# Patient Record
Sex: Male | Born: 1995 | Race: Black or African American | Hispanic: No | Marital: Single | Smoking: Current every day smoker
Health system: Southern US, Community
[De-identification: ages and names within clinical notes are randomized; demographics above are authoritative.]

## PROBLEM LIST (undated history)

## (undated) ENCOUNTER — Emergency Department (HOSPITAL_COMMUNITY): Discharge status: Absent without leave | Payer: Medicaid Other

## (undated) DIAGNOSIS — I609 Nontraumatic subarachnoid hemorrhage, unspecified: Secondary | ICD-10-CM

## (undated) DIAGNOSIS — J45909 Unspecified asthma, uncomplicated: Secondary | ICD-10-CM

## (undated) HISTORY — PX: STOMACH SURGERY: SHX791

## (undated) HISTORY — PX: KNEE SURGERY: SHX244

## (undated) HISTORY — PX: PENIS REVASCULARIZATION SURGERY: SHX740

---
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
## 2015-02-18 ENCOUNTER — Emergency Department (HOSPITAL_COMMUNITY): Payer: Medicaid Other

## 2015-02-18 ENCOUNTER — Emergency Department (HOSPITAL_COMMUNITY)
Admission: EM | Admit: 2015-02-18 | Discharge: 2015-02-18 | Disposition: A | Discharge status: Home / Self Care | Payer: Medicaid Other | Attending: Emergency Medicine | Admitting: Emergency Medicine

## 2015-02-18 ENCOUNTER — Encounter (HOSPITAL_COMMUNITY): Payer: Self-pay | Admitting: Emergency Medicine

## 2015-02-18 DIAGNOSIS — S80812A Abrasion, left lower leg, initial encounter: Secondary | ICD-10-CM | POA: Diagnosis not present

## 2015-02-18 DIAGNOSIS — Y9289 Other specified places as the place of occurrence of the external cause: Secondary | ICD-10-CM | POA: Insufficient documentation

## 2015-02-18 DIAGNOSIS — S61411A Laceration without foreign body of right hand, initial encounter: Secondary | ICD-10-CM | POA: Diagnosis not present

## 2015-02-18 DIAGNOSIS — Y998 Other external cause status: Secondary | ICD-10-CM | POA: Insufficient documentation

## 2015-02-18 DIAGNOSIS — G44319 Acute post-traumatic headache, not intractable: Secondary | ICD-10-CM

## 2015-02-18 DIAGNOSIS — M79641 Pain in right hand: Secondary | ICD-10-CM

## 2015-02-18 DIAGNOSIS — S59902A Unspecified injury of left elbow, initial encounter: Secondary | ICD-10-CM | POA: Insufficient documentation

## 2015-02-18 DIAGNOSIS — Z8679 Personal history of other diseases of the circulatory system: Secondary | ICD-10-CM | POA: Insufficient documentation

## 2015-02-18 DIAGNOSIS — J45909 Unspecified asthma, uncomplicated: Secondary | ICD-10-CM | POA: Diagnosis not present

## 2015-02-18 DIAGNOSIS — S0990XA Unspecified injury of head, initial encounter: Secondary | ICD-10-CM | POA: Insufficient documentation

## 2015-02-18 DIAGNOSIS — Y9389 Activity, other specified: Secondary | ICD-10-CM | POA: Insufficient documentation

## 2015-02-18 DIAGNOSIS — S0003XA Contusion of scalp, initial encounter: Secondary | ICD-10-CM | POA: Diagnosis not present

## 2015-02-18 DIAGNOSIS — Z79899 Other long term (current) drug therapy: Secondary | ICD-10-CM | POA: Insufficient documentation

## 2015-02-18 DIAGNOSIS — S4991XA Unspecified injury of right shoulder and upper arm, initial encounter: Secondary | ICD-10-CM | POA: Insufficient documentation

## 2015-02-18 DIAGNOSIS — S6991XA Unspecified injury of right wrist, hand and finger(s), initial encounter: Secondary | ICD-10-CM | POA: Diagnosis present

## 2015-02-18 DIAGNOSIS — Z880 Allergy status to penicillin: Secondary | ICD-10-CM | POA: Diagnosis not present

## 2015-02-18 DIAGNOSIS — S62309B Unspecified fracture of unspecified metacarpal bone, initial encounter for open fracture: Secondary | ICD-10-CM

## 2015-02-18 DIAGNOSIS — S299XXA Unspecified injury of thorax, initial encounter: Secondary | ICD-10-CM | POA: Insufficient documentation

## 2015-02-18 DIAGNOSIS — S62398B Other fracture of other metacarpal bone, initial encounter for open fracture: Secondary | ICD-10-CM | POA: Diagnosis not present

## 2015-02-18 MED ORDER — OXYCODONE HCL 5 MG PO TABS
5.0000 mg | ORAL_TABLET | Freq: Once | ORAL | Status: AC
Start: 2015-02-18 — End: 2015-02-18
  Administered 2015-02-18: 5 mg via ORAL
  Filled 2015-02-18: qty 1

## 2015-02-18 MED ORDER — CLINDAMYCIN HCL 300 MG PO CAPS
300.0000 mg | ORAL_CAPSULE | Freq: Once | ORAL | Status: AC
  Administered 2015-02-18: 300 mg via ORAL
  Filled 2015-02-18: qty 1

## 2015-02-18 MED ORDER — CLINDAMYCIN HCL 300 MG PO CAPS: 300.0000 mg | ORAL_CAPSULE | Freq: Three times a day (TID) | ORAL | Status: DC

## 2015-02-18 MED ORDER — OXYCODONE HCL 5 MG PO TABS
5.0000 mg | ORAL_TABLET | Freq: Four times a day (QID) | ORAL | Status: DC | PRN
Start: 2015-02-18 — End: 2017-05-16
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
## 2015-02-18 NOTE — ED Notes (Signed)
Ortho aware of need for splint 
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
## 2015-02-18 NOTE — ED Notes (Signed)
Ice applied to right hand.

## 2015-02-18 NOTE — Progress Notes (Signed)
Orthopedic Tech Progress Note Patient Details:  Flavia ShipperDenarius J [facility] 05/30/1996 [id number]  Ortho Devices Type of Ortho Device: Ace wrap, Ulna gutter splint Ortho Device/Splint Location: RUE Ortho Device/Splint Interventions: Ordered, Application   Jennye MoccasinHughes, Glynis Hunsucker Craig 02/18/2015, 7:41 PM

## 2015-02-18 NOTE — ED Notes (Signed)
Sling applied to right arm.  Arm elevated above heart, fingers warm to touch and moves well.  Discharge instructions and prescriptions reviewed, voiced understanding

## 2015-02-26 DIAGNOSIS — Z4801 Encounter for change or removal of surgical wound dressing: Secondary | ICD-10-CM | POA: Insufficient documentation

## 2015-02-26 DIAGNOSIS — Z880 Allergy status to penicillin: Secondary | ICD-10-CM | POA: Insufficient documentation

## 2015-02-26 DIAGNOSIS — Z720 Tobacco use: Secondary | ICD-10-CM | POA: Diagnosis not present

## 2015-02-26 DIAGNOSIS — J45909 Unspecified asthma, uncomplicated: Secondary | ICD-10-CM | POA: Insufficient documentation

## 2015-02-26 DIAGNOSIS — Z79899 Other long term (current) drug therapy: Secondary | ICD-10-CM | POA: Insufficient documentation

## 2015-02-27 ENCOUNTER — Emergency Department (HOSPITAL_COMMUNITY)
Admission: EM | Admit: 2015-02-27 | Discharge: 2015-02-27 | Disposition: A | Discharge status: Home / Self Care | Payer: Medicaid Other | Attending: Emergency Medicine | Admitting: Emergency Medicine

## 2015-02-27 ENCOUNTER — Encounter (HOSPITAL_COMMUNITY): Payer: Self-pay | Admitting: Emergency Medicine

## 2015-02-27 DIAGNOSIS — Z5189 Encounter for other specified aftercare: Secondary | ICD-10-CM

## 2015-02-27 MED ORDER — CLINDAMYCIN HCL 150 MG PO CAPS: 300.0000 mg | ORAL_CAPSULE | Freq: Three times a day (TID) | ORAL | Status: DC
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
## 2015-02-27 NOTE — Discharge Instructions (Signed)
IT IS VERY IMPORTANT THAT YOU FOLLOW UP WITH DR. GRAMIG AS DIRECTED AT YOUR LAST VISIT. CONTINUE THE ANTIBIOTICS AS DIRECTED.   CALL DR. GRAMIG'S OFFICE FIRST THING Monday MORNING TO SCHEDULE YOUR FOLLOW UP.
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
## 2015-02-27 NOTE — ED Notes (Signed)
Pt left with all belongings and ambulated out of treatment area.  

## 2015-07-16 ENCOUNTER — Encounter (HOSPITAL_COMMUNITY): Payer: Self-pay | Admitting: Emergency Medicine

## 2015-07-16 ENCOUNTER — Emergency Department (HOSPITAL_COMMUNITY): Payer: Medicaid Other

## 2015-07-16 ENCOUNTER — Emergency Department (HOSPITAL_COMMUNITY)
Admission: EM | Admit: 2015-07-16 | Discharge: 2015-07-16 | Disposition: A | Discharge status: Home / Self Care | Payer: Medicaid Other | Attending: Emergency Medicine | Admitting: Emergency Medicine

## 2015-07-16 DIAGNOSIS — M25561 Pain in right knee: Secondary | ICD-10-CM

## 2015-07-16 DIAGNOSIS — S80211A Abrasion, right knee, initial encounter: Secondary | ICD-10-CM | POA: Insufficient documentation

## 2015-07-16 DIAGNOSIS — Z8679 Personal history of other diseases of the circulatory system: Secondary | ICD-10-CM | POA: Diagnosis not present

## 2015-07-16 DIAGNOSIS — Z7951 Long term (current) use of inhaled steroids: Secondary | ICD-10-CM | POA: Diagnosis not present

## 2015-07-16 DIAGNOSIS — S8991XA Unspecified injury of right lower leg, initial encounter: Secondary | ICD-10-CM | POA: Diagnosis present

## 2015-07-16 DIAGNOSIS — Y998 Other external cause status: Secondary | ICD-10-CM | POA: Diagnosis not present

## 2015-07-16 DIAGNOSIS — S3992XA Unspecified injury of lower back, initial encounter: Secondary | ICD-10-CM | POA: Diagnosis not present

## 2015-07-16 DIAGNOSIS — Y92410 Unspecified street and highway as the place of occurrence of the external cause: Secondary | ICD-10-CM | POA: Diagnosis not present

## 2015-07-16 DIAGNOSIS — M79604 Pain in right leg: Secondary | ICD-10-CM

## 2015-07-16 DIAGNOSIS — Z880 Allergy status to penicillin: Secondary | ICD-10-CM | POA: Diagnosis not present

## 2015-07-16 DIAGNOSIS — F17210 Nicotine dependence, cigarettes, uncomplicated: Secondary | ICD-10-CM | POA: Diagnosis not present

## 2015-07-16 DIAGNOSIS — W19XXXA Unspecified fall, initial encounter: Secondary | ICD-10-CM

## 2015-07-16 DIAGNOSIS — Z792 Long term (current) use of antibiotics: Secondary | ICD-10-CM | POA: Insufficient documentation

## 2015-07-16 DIAGNOSIS — Z79899 Other long term (current) drug therapy: Secondary | ICD-10-CM | POA: Insufficient documentation

## 2015-07-16 DIAGNOSIS — Y9389 Activity, other specified: Secondary | ICD-10-CM | POA: Diagnosis not present

## 2015-07-16 DIAGNOSIS — J45909 Unspecified asthma, uncomplicated: Secondary | ICD-10-CM | POA: Insufficient documentation

## 2015-07-16 MED ORDER — NAPROXEN 250 MG PO TABS: 250.0000 mg | ORAL_TABLET | Freq: Two times a day (BID) | ORAL | Status: DC

## 2015-07-16 NOTE — Discharge Instructions (Signed)
How to Use a Knee Brace A knee brace is a device that you wear to support your knee, especially if the knee is healing after an injury or surgery. There are several types of knee braces. Some are designed to prevent an injury (prophylactic brace). These are often worn during sports. Others support an injured knee (functional brace) or keep it still while it heals (rehabilitative brace). People with severe arthritis of the knee may benefit from a brace that takes some pressure off the knee (unloader brace). Most knee braces are made from a combination of cloth and metal or plastic.  You may need to wear a knee brace to:  Relieve knee pain.  Help your knee support your weight (improve stability).  Help you walk farther (improve mobility).  Prevent injury.  Support your knee while it heals from surgery or from an injury. RISKS AND COMPLICATIONS Generally, knee braces are very safe to wear. However, problems may occur, including: 1. Skin irritation that may lead to infection. 2. Making your condition worse if you wear the brace in the wrong way. HOW TO USE A KNEE BRACE Different braces will have different instructions for use. Your health care provider will tell you or show you: 1. How to put on your brace. 2. How to adjust the brace. 3. When and how often to wear the brace. 4. How to remove the brace. 5. If you will need any assistive devices in addition to the brace, such as crutches or a cane. In general, your brace should: 1. Have the hinge of the brace line up with the bend of your knee. 2. Have straps, hooks, or tapes that fasten snugly around your leg. 3. Not feel too tight or too loose. HOW TO CARE FOR A KNEE BRACE 1. Check your brace often for signs of damage, such as loose connections or attachments. Your knee brace may get damaged or wear out during normal use. 2. Wash the fabric parts of your brace with soap and water. 3. Read the insert that comes with your brace for other  specific care instructions. SEEK MEDICAL CARE IF: 1. Your knee brace is too loose or too tight and you cannot adjust it. 2. Your knee brace causes skin redness, swelling, bruising, or irritation. 3. Your knee brace is not helping. 4. Your knee brace is making your knee pain worse.   This information is not intended to replace advice given to you by your health care provider. Make sure you discuss any questions you have with your health care provider.   Document Released: 10/13/2003 Document Revised: 04/13/2015 Document Reviewed: 11/15/2014 Elsevier Interactive Patient Education 2016 Elsevier Inc. Knee Pain Knee pain is a very common symptom and can have many causes. Knee pain often goes away when you follow your health care provider's instructions for relieving pain and discomfort at home. However, knee pain can develop into a condition that needs treatment. Some conditions may include:  Arthritis caused by wear and tear (osteoarthritis).  Arthritis caused by swelling and irritation (rheumatoid arthritis or gout).  A cyst or growth in your knee.  An infection in your knee joint.  An injury that will not heal.  Damage, swelling, or irritation of the tissues that support your knee (torn ligaments or tendinitis). If your knee pain continues, additional tests may be ordered to diagnose your condition. Tests may include X-rays or other imaging studies of your knee. You may also need to have fluid removed from your knee. Treatment for ongoing  knee pain depends on the cause, but treatment may include: 3. Medicines to relieve pain or swelling. 4. Steroid injections in your knee. 5. Physical therapy. 6. Surgery. HOME CARE INSTRUCTIONS 6. Take medicines only as directed by your health care provider. 7. Rest your knee and keep it raised (elevated) while you are resting. 8. Do not do things that cause or worsen pain. 9. Avoid high-impact activities or exercises, such as running, jumping rope,  or doing jumping jacks. 10. Apply ice to the knee area: 1. Put ice in a plastic bag. 2. Place a towel between your skin and the bag. 3. Leave the ice on for 20 minutes, 2-3 times a day. 11. Ask your health care provider if you should wear an elastic knee support. 12. Keep a pillow under your knee when you sleep. 13. Lose weight if you are overweight. Extra weight can put pressure on your knee. 14. Do not use any tobacco products, including cigarettes, chewing tobacco, or electronic cigarettes. If you need help quitting, ask your health care provider. Smoking may slow the healing of any bone and joint problems that you may have. SEEK MEDICAL CARE IF: 4. Your knee pain continues, changes, or gets worse. 5. You have a fever along with knee pain. 6. Your knee buckles or locks up. 7. Your knee becomes more swollen. SEEK IMMEDIATE MEDICAL CARE IF:  4. Your knee joint feels hot to the touch. 5. You have chest pain or trouble breathing.   This information is not intended to replace advice given to you by your health care provider. Make sure you discuss any questions you have with your health care provider.   Document Released: 05/20/2007 Document Revised: 08/13/2014 Document Reviewed: 03/08/2014 Elsevier Interactive Patient Education 2016 [employer]. Crutch Use Crutches are used to take weight off one of your legs or feet when you stand or walk. It is important to use crutches that fit properly. When fitted properly:  Each crutch should be 2-3 finger widths below the armpit.  Your weight should be supported by your hand, and not by resting the armpit on the crutch. RISKS AND COMPLICATIONS Damage to the nerves that extend from your armpit to your hand and arm. To prevent this from happening, make sure your crutches fit properly and do not put pressure on your armpit when using them. HOW TO USE YOUR CRUTCHES If you have been instructed to use partial weight bearing, apply (bear) the amount of  weight as your health care provider suggests. Do not bear weight in an amount that causes pain to the area of injury. Walking 7. Step with the crutches. 8. Swing the healthy leg slightly ahead of the crutches. Going Up Steps If there is no handrail: 15. Step up with the healthy leg. 16. Step up with the crutches and injured leg. 17. Continue in this way. If there is a handrail: 8. Hold both crutches in one hand. 9. Place your free hand on the handrail. 10. While putting your weight on your arms, lift your healthy leg to the step. 11. Bring the crutches and the injured leg up to that step. 12. Continue in this way. Going Down Steps Be very careful, as going down stairs with crutches is very challenging. If there is no handrail: 6. Step down with the injured leg and crutches. 7. Step down with the healthy leg. If there is a handrail: 5. Place your hand on the handrail. 6. Hold both crutches with your free hand. 7. Lower  your injured leg and crutch to the step below you. Make sure to keep the crutch tips in the center of the step, never on the edge. 8. Lower your healthy leg to that step. 9. Continue in this way. Standing Up 1. Hold the injured leg forward. 2. Grab the armrest with one hand and the top of the crutches with the other hand. 3. Using these supports, pull yourself up to a standing position. Sitting Down 1. Hold the injured leg forward. 2. Grab the armrest with one hand and the top of the crutches with the other hand. 3. Lower yourself to a sitting position. SEEK MEDICAL CARE IF:  You still feel unsteady on your feet.  You develop new pain, for example in your armpits, back, shoulder, wrist, or hip.  You develop any numbness or tingling. SEEK IMMEDIATE MEDICAL CARE IF:  You fall.   This information is not intended to replace advice given to you by your health care provider. Make sure you discuss any questions you have with your health care provider.   Document  Released: 07/20/2000 Document Revised: 08/13/2014 Document Reviewed: 03/30/2013 Elsevier Interactive Patient Education [employer] Elsevier Inc.
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
## 2015-11-10 ENCOUNTER — Emergency Department (HOSPITAL_COMMUNITY)
Admission: EM | Admit: 2015-11-10 | Discharge: 2015-11-10 | Disposition: A | Discharge status: Home / Self Care | Payer: Medicaid Other | Attending: Emergency Medicine | Admitting: Emergency Medicine

## 2015-11-10 ENCOUNTER — Encounter (HOSPITAL_COMMUNITY): Payer: Self-pay | Admitting: Emergency Medicine

## 2015-11-10 DIAGNOSIS — Z792 Long term (current) use of antibiotics: Secondary | ICD-10-CM | POA: Insufficient documentation

## 2015-11-10 DIAGNOSIS — Z8619 Personal history of other infectious and parasitic diseases: Secondary | ICD-10-CM | POA: Insufficient documentation

## 2015-11-10 DIAGNOSIS — F17210 Nicotine dependence, cigarettes, uncomplicated: Secondary | ICD-10-CM | POA: Diagnosis not present

## 2015-11-10 DIAGNOSIS — Z9889 Other specified postprocedural states: Secondary | ICD-10-CM | POA: Diagnosis not present

## 2015-11-10 DIAGNOSIS — Z791 Long term (current) use of non-steroidal anti-inflammatories (NSAID): Secondary | ICD-10-CM | POA: Diagnosis not present

## 2015-11-10 DIAGNOSIS — Z79899 Other long term (current) drug therapy: Secondary | ICD-10-CM | POA: Insufficient documentation

## 2015-11-10 DIAGNOSIS — J45909 Unspecified asthma, uncomplicated: Secondary | ICD-10-CM | POA: Insufficient documentation

## 2015-11-10 DIAGNOSIS — Z880 Allergy status to penicillin: Secondary | ICD-10-CM | POA: Insufficient documentation

## 2015-11-10 DIAGNOSIS — Z711 Person with feared health complaint in whom no diagnosis is made: Secondary | ICD-10-CM

## 2015-11-10 DIAGNOSIS — Z7951 Long term (current) use of inhaled steroids: Secondary | ICD-10-CM | POA: Insufficient documentation

## 2015-11-10 DIAGNOSIS — Z8679 Personal history of other diseases of the circulatory system: Secondary | ICD-10-CM | POA: Diagnosis not present

## 2015-11-10 DIAGNOSIS — Z202 Contact with and (suspected) exposure to infections with a predominantly sexual mode of transmission: Secondary | ICD-10-CM | POA: Diagnosis present

## 2015-11-10 LAB — URINALYSIS, ROUTINE W REFLEX MICROSCOPIC
Bilirubin Urine: NEGATIVE
Glucose, UA: NEGATIVE mg/dL
Hgb urine dipstick: NEGATIVE
KETONES UR: NEGATIVE mg/dL
Leukocytes, UA: NEGATIVE
NITRITE: NEGATIVE
PROTEIN: NEGATIVE mg/dL
Specific Gravity, Urine: 1.027 (ref 1.005–1.030)
pH: 6.5 (ref 5.0–8.0)

## 2015-11-10 LAB — RAPID HIV SCREEN (HIV 1/2 AB+AG)
HIV 1/2 ANTIBODIES: NONREACTIVE
HIV-1 P24 ANTIGEN - HIV24: NONREACTIVE

## 2015-11-10 NOTE — ED Provider Notes (Signed)
CSN: [id number]     Arrival date & time 11/10/15  0848 History  By signing my name below, I, Ronney Lion, attest that this documentation has been prepared under the direction and in the presence of Fayrene Helper, PA-C. Electronically Signed: Ronney Lion, ED Scribe. 11/10/2015. 9:39 AM.    Chief Complaint  Patient presents with  . Exposure to STD   The history is provided by the patient. No language interpreter was used.    HPI Comments: Jesse Dean is a 20 y.o. male with a history of penis revascularization surgery, who presents to the Emergency Department complaining of exposure to STD's. Patient states his girlfriend and both of his baby mothers have herpes. He states he has never had any symptoms, including rash, but states, "I was told by my kid's doctor I might be a carrier." Patient notes a history of chlamydia and gonorrhea in the past. Patient states he has had sexual contact with 17 partners in the past 6 months although he reports he has not used protection consistently. He denies fever, dysuria, penile discharge, rash, or abdominal pain.   Past Medical History  Diagnosis Date  . Asthma   . 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)    Past Surgical History  Procedure Laterality Date  . Penis revascularization surgery    . Stomach surgery    . Knee surgery     No family history on file. Social History  Substance Use Topics  . Smoking status: Current Every Day Smoker -- 0.50 packs/day    Types: Cigarettes  . Smokeless tobacco: None  . Alcohol Use: No     Comment: occasionally    Review of Systems  Constitutional: Negative for fever.  Gastrointestinal: Negative for abdominal pain.  Genitourinary: Negative for dysuria and discharge.  Skin: Negative for rash.      Allergies  Penicillins  Home Medications   Prior to Admission medications   Medication Sig Start Date End Date Taking? Authorizing Provider  acyclovir (ZOVIRAX) 400 MG tablet Take 1 tablet (400 mg total) by mouth 3  (three) times daily. Patient not taking: Reported on 12/04/2014 07/30/14   [name], PA-C  ADVAIR HFA 115-21 MCG/ACT inhaler Inhale 2 puffs into the lungs 2 (two) times daily. 11/08/14   Historical Provider, MD  albuterol (PROVENTIL HFA;VENTOLIN HFA) 108 (90 BASE) MCG/ACT inhaler Inhale 2 puffs into the lungs every 6 (six) hours as needed for wheezing.    Historical Provider, MD  azithromycin (ZITHROMAX) 250 MG tablet Take 1 tablet (250 mg total) by mouth daily. Take first 2 tablets together, then 1 every day until finished. Patient not taking: Reported on 02/18/2015 11/19/14   [name], PA-C  benzonatate (TESSALON) 100 MG capsule Take 1 capsule (100 mg total) by mouth every 8 (eight) hours. Patient not taking: Reported on 02/18/2015 11/19/14   [name], PA-C  cetirizine (ZYRTEC) 10 MG tablet Take 10 mg by mouth at bedtime. 11/08/14   Historical Provider, MD  clindamycin (CLEOCIN) 150 MG capsule Take 2 capsules (300 mg total) by mouth 3 (three) times daily. 02/27/15   [name], NP  naproxen (NAPROSYN) 250 MG tablet Take 1 tablet (250 mg total) by mouth 2 (two) times daily with a meal. 07/16/15   [name], PA-C  oxyCODONE (OXY IR/ROXICODONE) 5 MG immediate release tablet Take 1 tablet (5 mg total) by mouth every 6 (six) hours as needed for severe pain. 02/18/15   [name], MD   BP 120/65 mmHg  Pulse 62  Temp(Src) 98.1 F (36.7 C) (Oral)  Resp 18  Ht 5\' 11"  (1.803 m)  Wt 162 lb (73.483 kg)  BMI 22.60 kg/m2  SpO2 100% Physical Exam  Constitutional: He is oriented to person, place, and time. He appears well-developed and well-nourished. No distress.  HENT:  Head: Normocephalic and atraumatic.  Eyes: Conjunctivae and EOM are normal.  Neck: Neck supple. No tracheal deviation present.  Cardiovascular: Normal rate.   Pulmonary/Chest: Effort normal. No respiratory distress.  Abdominal: Hernia confirmed negative in the right inguinal area and confirmed negative in the  left inguinal area.  Genitourinary: Testes normal and penis normal. Circumcised.  Bilateral inguinal lymphadenopathy noted. Circumcised penis. Free of lesion or rash. Testicle within normal lines. No inguinal hernia noted. Normal scrotum.  Musculoskeletal: Normal range of motion.  Lymphadenopathy:       Right: Inguinal adenopathy present.       Left: Inguinal adenopathy present.  Neurological: He is alert and oriented to person, place, and time.  Skin: Skin is warm and dry.  Psychiatric: He has a normal mood and affect. His behavior is normal.  Nursing note and vitals reviewed.   ED Course  Procedures (including critical care time)  DIAGNOSTIC STUDIES: Oxygen Saturation is 100% on RA, normal by my interpretation.    COORDINATION OF CARE: 9:39 AM - Discussed treatment plan with pt at bedside which includes STD testing - both blood tests and UA. Pt verbalized understanding and agreed to plan.   Labs Review Labs Reviewed  URINALYSIS, ROUTINE W REFLEX MICROSCOPIC (NOT [facility])  RPR  RAPID HIV SCREEN (HIV 1/2 AB+AG)  HSV 1 ANTIBODY, IGG  HSV 2 ANTIBODY, IGG  GC/CHLAMYDIA PROBE AMP ([place]) NOT AT [facility]    Imaging Review No results found. I have personally reviewed and evaluated these images and lab results as part of my medical decision-making.   EKG Interpretation None      MDM   Final diagnoses:  Concern about STD in male without diagnosis   BP 124/62 mmHg  Pulse 70  Temp(Src) 98.1 F (36.7 C) (Oral)  Resp 16  Ht 5\' 11"  (1.803 m)  Wt 73.483 kg  BMI 22.60 kg/m2  SpO2 100%  I personally performed the services described in this documentation, which was scribed in my presence. The recorded information has been reviewed and is accurate.   Pt arrives for asymptomatic STD check. Initial testing is negative for trichomonas. Discussed safe sexual practices. Pt is advised to follow up for free testing at local health department in the future. Pt appears safe for  discharge.      Fayrene HelperBowie Sabra Sessler, PA-C 11/10/15 1055  Benjiman CoreNathan Pickering, MD 11/11/15 [phone]

## 2015-11-10 NOTE — ED Notes (Signed)
States his girlfriend and "both of his babymama's" have STD's. Pt denies sx.

## 2015-11-10 NOTE — Discharge Instructions (Signed)
Herpes Simplex Test There are two common types of herpes simplex virus (HSV). These are classified as Type 1 (HSV1) or Type 2 (HSV2). Type 1 often causes cold sores on or around the mouth and sometimes on or around the eyes. Type 2 is commonly known as a sexually transmitted infection that causes sores in and around the genitals. Both types of herpes simplex can cause sores in different areas. There are two types of herpes simplex tests. These include:  Culture. This consists of collecting and testing a sample of fluid with a cotton swab from an open sore. This can only be done during an active infection (outbreak). Culture tests take several days to complete but are very accurate.  HSV blood tests. This test is not as accurate as a culture. However, HSV blood tests are faster than cultures, often providing a test result within one day.  HSV antibody test. This checks for the presence of antibodies against HSV in your blood. Antibodies are proteins your body makes to help fight infection.  HSV antigen test. This checks for the presence of the HSV germ (antigen) in your blood. Your health care provider may recommend you have a HSV test if:  He or she believes you have a HSV infection.  You have a weakened immune system (immunocompromised) and you have sores around your mouth or genitals that look like HSV eruptions.  You have a fever of unknown origin (FUO).  You are pregnant, have herpes, and are expecting to deliver a baby vaginally in the next 6-8 weeks. RESULTS It is your responsibility to obtain your test results. Ask the lab or department performing the test when and how you will get your results. Contact your health care provider to discuss any questions you have about your results. Range of Normal Values Ranges for normal values may vary among different labs and hospitals. You should always check with your health care provider after having lab work or other tests done to discuss whether  your values are considered within normal limits. Normal findings include:  No HSV antigen or antibodies present in your blood.  No HSV antigen present in cultured fluid. Meaning of Results Outside Normal Ranges The following test results may indicate that you have an active HSV infection:  Positive culture for HSV1 or HSV2.  Presence of HSV1 or HSV2 antigens in your blood.  Presence of certain HSV1 or HSV2 antibodies (IgM) in your blood. Discuss your test results with your health care provider. He or she will use the results to make a diagnosis and determine a treatment plan that is right for you.   This information is not intended to replace advice given to you by your health care provider. Make sure you discuss any questions you have with your health care provider.   Document Released: 08/25/2004 Document Revised: 08/13/2014 Document Reviewed: 12/08/2013 Elsevier Interactive Patient Education 2016 [employer]  Sexually Transmitted Disease A sexually transmitted disease (STD) is a disease or infection that may be passed (transmitted) from person to person, usually during sexual activity. This may happen by way of saliva, semen, blood, vaginal mucus, or urine. Common STDs include:  Gonorrhea.  Chlamydia.  Syphilis.  HIV and AIDS.  Genital herpes.  Hepatitis B and C.  Trichomonas.  Human papillomavirus (HPV).  Pubic lice.  Scabies.  Mites.  Bacterial vaginosis. WHAT ARE CAUSES OF STDs? An STD may be caused by bacteria, a virus, or parasites. STDs are often transmitted during sexual activity if one person  is infected. However, they may also be transmitted through nonsexual means. STDs may be transmitted after:   Sexual intercourse with an infected person.  Sharing sex toys with an infected person.  Sharing needles with an infected person or using unclean piercing or tattoo needles.  Having intimate contact with the genitals, mouth, or rectal areas of an  infected person.  Exposure to infected fluids during birth. WHAT ARE THE SIGNS AND SYMPTOMS OF STDs? Different STDs have different symptoms. Some people may not have any symptoms. If symptoms are present, they may include:  Painful or bloody urination.  Pain in the pelvis, abdomen, vagina, anus, throat, or eyes.  A skin rash, itching, or irritation.  Growths, ulcerations, blisters, or sores in the genital and anal areas.  Abnormal vaginal discharge with or without bad odor.  Penile discharge in men.  Fever.  Pain or bleeding during sexual intercourse.  Swollen glands in the groin area.  Yellow skin and eyes (jaundice). This is seen with hepatitis.  Swollen testicles.  Infertility.  Sores and blisters in the mouth. HOW ARE STDs DIAGNOSED? To make a diagnosis, your health care provider may:  Take a medical history.  Perform a physical exam.  Take a sample of any discharge to examine.  Swab the throat, cervix, opening to the penis, rectum, or vagina for testing.  Test a sample of your first morning urine.  Perform blood tests.  Perform a Pap test, if this applies.  Perform a colposcopy.  Perform a laparoscopy. HOW ARE STDs TREATED? Treatment depends on the STD. Some STDs may be treated but not cured.  Chlamydia, gonorrhea, trichomonas, and syphilis can be cured with antibiotic medicine.  Genital herpes, hepatitis, and HIV can be treated, but not cured, with prescribed medicines. The medicines lessen symptoms.  Genital warts from HPV can be treated with medicine or by freezing, burning (electrocautery), or surgery. Warts may come back.  HPV cannot be cured with medicine or surgery. However, abnormal areas may be removed from the cervix, vagina, or vulva.  If your diagnosis is confirmed, your recent sexual partners need treatment. This is true even if they are symptom-free or have a negative culture or evaluation. They should not have sex until their health  care providers say it is okay.  Your health care provider may test you for infection again 3 months after treatment. HOW CAN I REDUCE MY RISK OF GETTING AN STD? Take these steps to reduce your risk of getting an STD:  Use latex condoms, dental dams, and water-soluble lubricants during sexual activity. Do not use petroleum jelly or oils.  Avoid having multiple sex partners.  Do not have sex with someone who has other sex partners  Do not have sex with anyone you do not know or who is at high risk for an STD.  Avoid risky sex practices that can break your skin.  Do not have sex if you have open sores on your mouth or skin.  Avoid drinking too much alcohol or taking illegal drugs. Alcohol and drugs can affect your judgment and put you in a vulnerable position.  Avoid engaging in oral and anal sex acts.  Get vaccinated for HPV and hepatitis. If you have not received these vaccines in the past, talk to your health care provider about whether one or both might be right for you.  If you are at risk of being infected with HIV, it is recommended that you take a prescription medicine daily to prevent HIV infection.  This is called pre-exposure prophylaxis (PrEP). You are considered at risk if:  You are a man who has sex with other men (MSM).  You are a heterosexual man or woman and are sexually active with more than one partner.  You take drugs by injection.  You are sexually active with a partner who has HIV.  Talk with your health care provider about whether you are at high risk of being infected with HIV. If you choose to begin PrEP, you should first be tested for HIV. You should then be tested every 3 months for as long as you are taking PrEP. WHAT SHOULD I DO IF I THINK I HAVE AN STD?  See your health care provider.  Tell your sexual partner(s). They should be tested and treated for any STDs.  Do not have sex until your health care provider says it is okay. WHEN SHOULD I GET  IMMEDIATE MEDICAL CARE? Contact your health care provider right away if:   You have severe abdominal pain.  You are a man and notice swelling or pain in your testicles.  You are a woman and notice swelling or pain in your vagina.   This information is not intended to replace advice given to you by your health care provider. Make sure you discuss any questions you have with your health care provider.   Document Released: 10/13/2002 Document Revised: 08/13/2014 Document Reviewed: 02/10/2013 Elsevier Interactive Patient Education [employer]

## 2015-11-11 LAB — GC/CHLAMYDIA PROBE AMP (~~LOC~~) NOT AT ARMC
Chlamydia: NEGATIVE
Neisseria Gonorrhea: NEGATIVE

## 2015-11-11 LAB — HSV 2 ANTIBODY, IGG: HSV 2 Glycoprotein G Ab, IgG: 7 index — ABNORMAL HIGH (ref 0.00–0.90)

## 2015-11-11 LAB — HSV 1 ANTIBODY, IGG: HSV 1 Glycoprotein G Ab, IgG: <0.91 index (ref 0.00–0.90)

## 2015-11-11 LAB — RPR: RPR Ser Ql: NONREACTIVE

## 2016-08-15 IMAGING — DX DG CHEST 2V
2 series · 2 of 2 positions shown · non-contrast
Comparison: 07/10/2013

CLINICAL DATA: Cough, headache, fever for 2 weeks.  Asthma history

EXAM:
CHEST  2 VIEW

[chest pa]
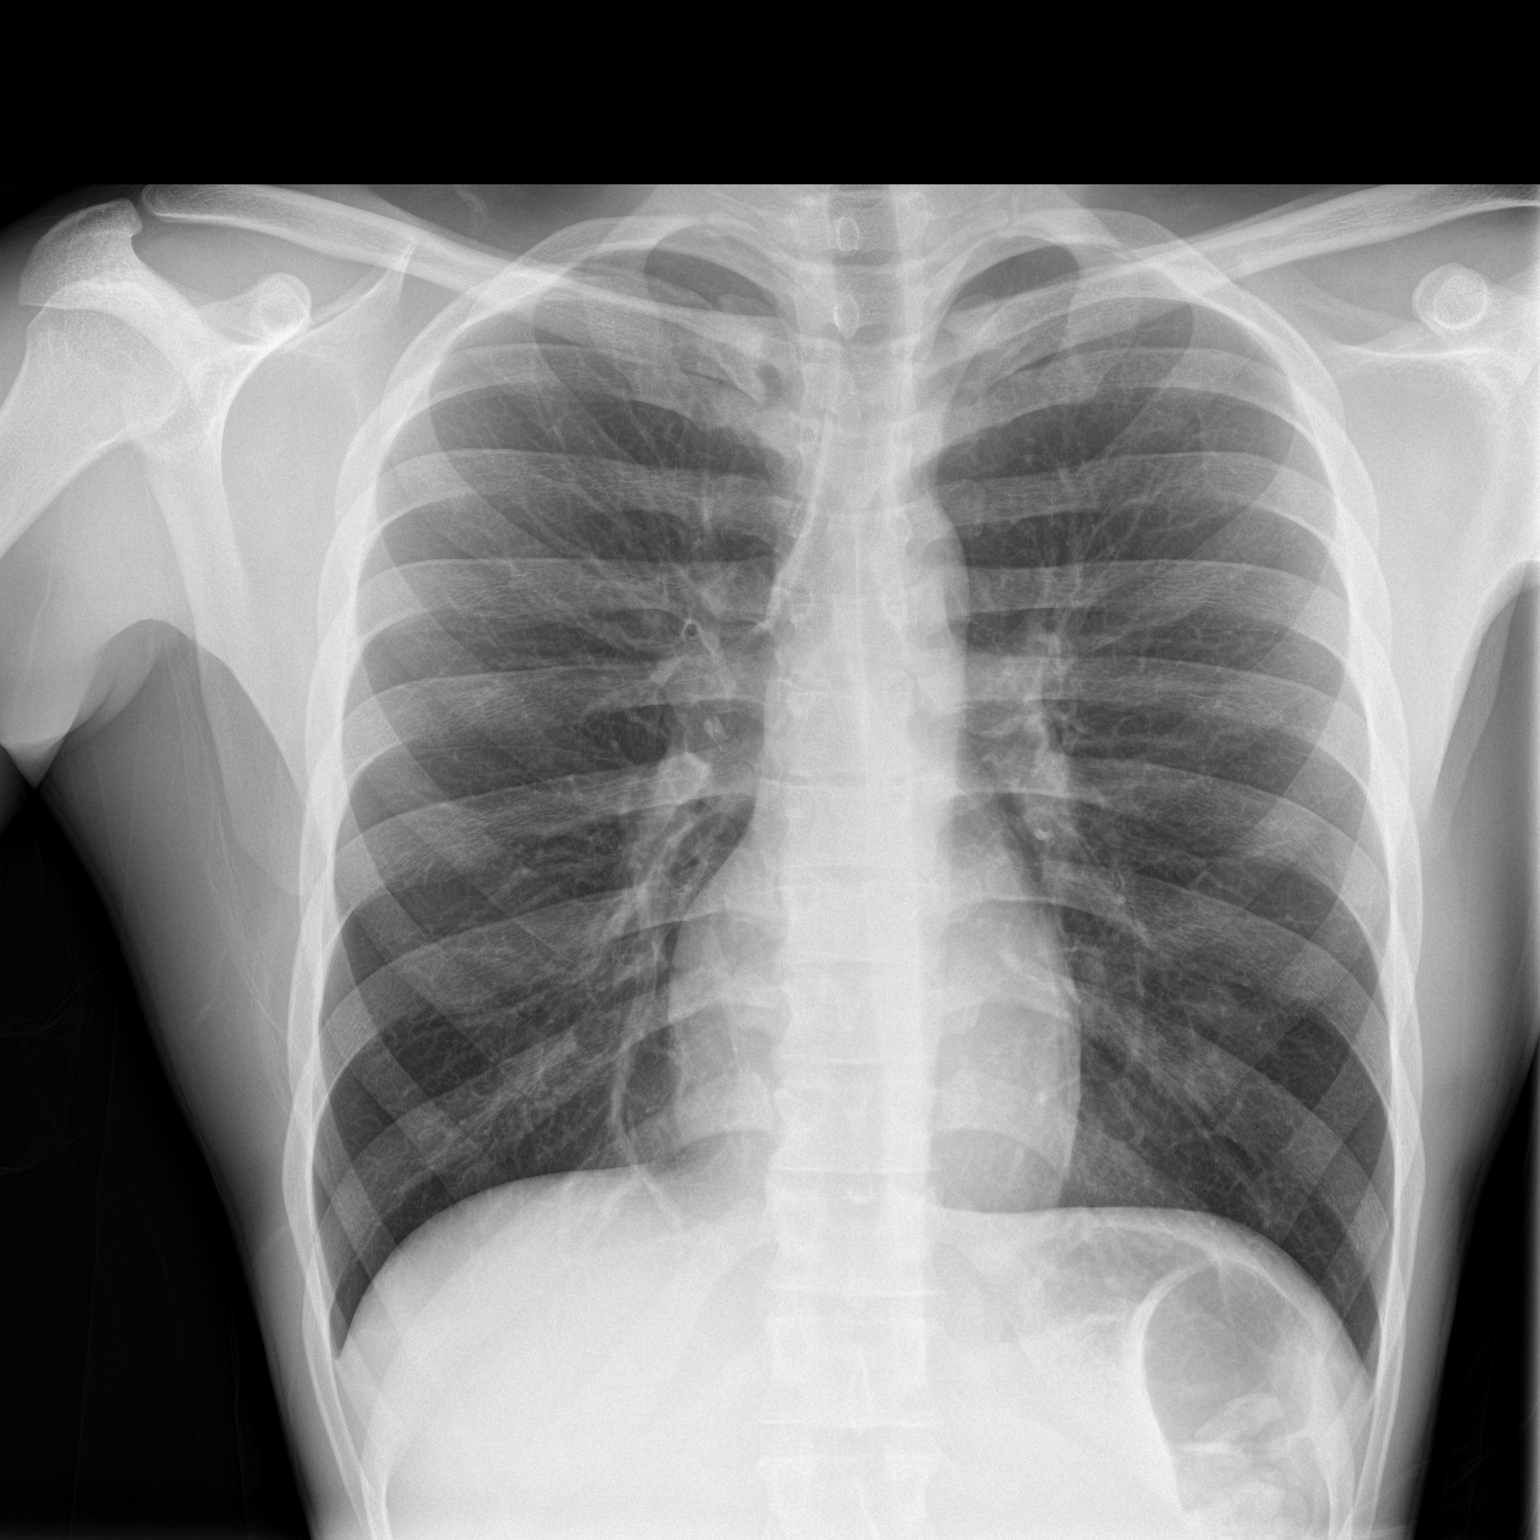

[chest lat]
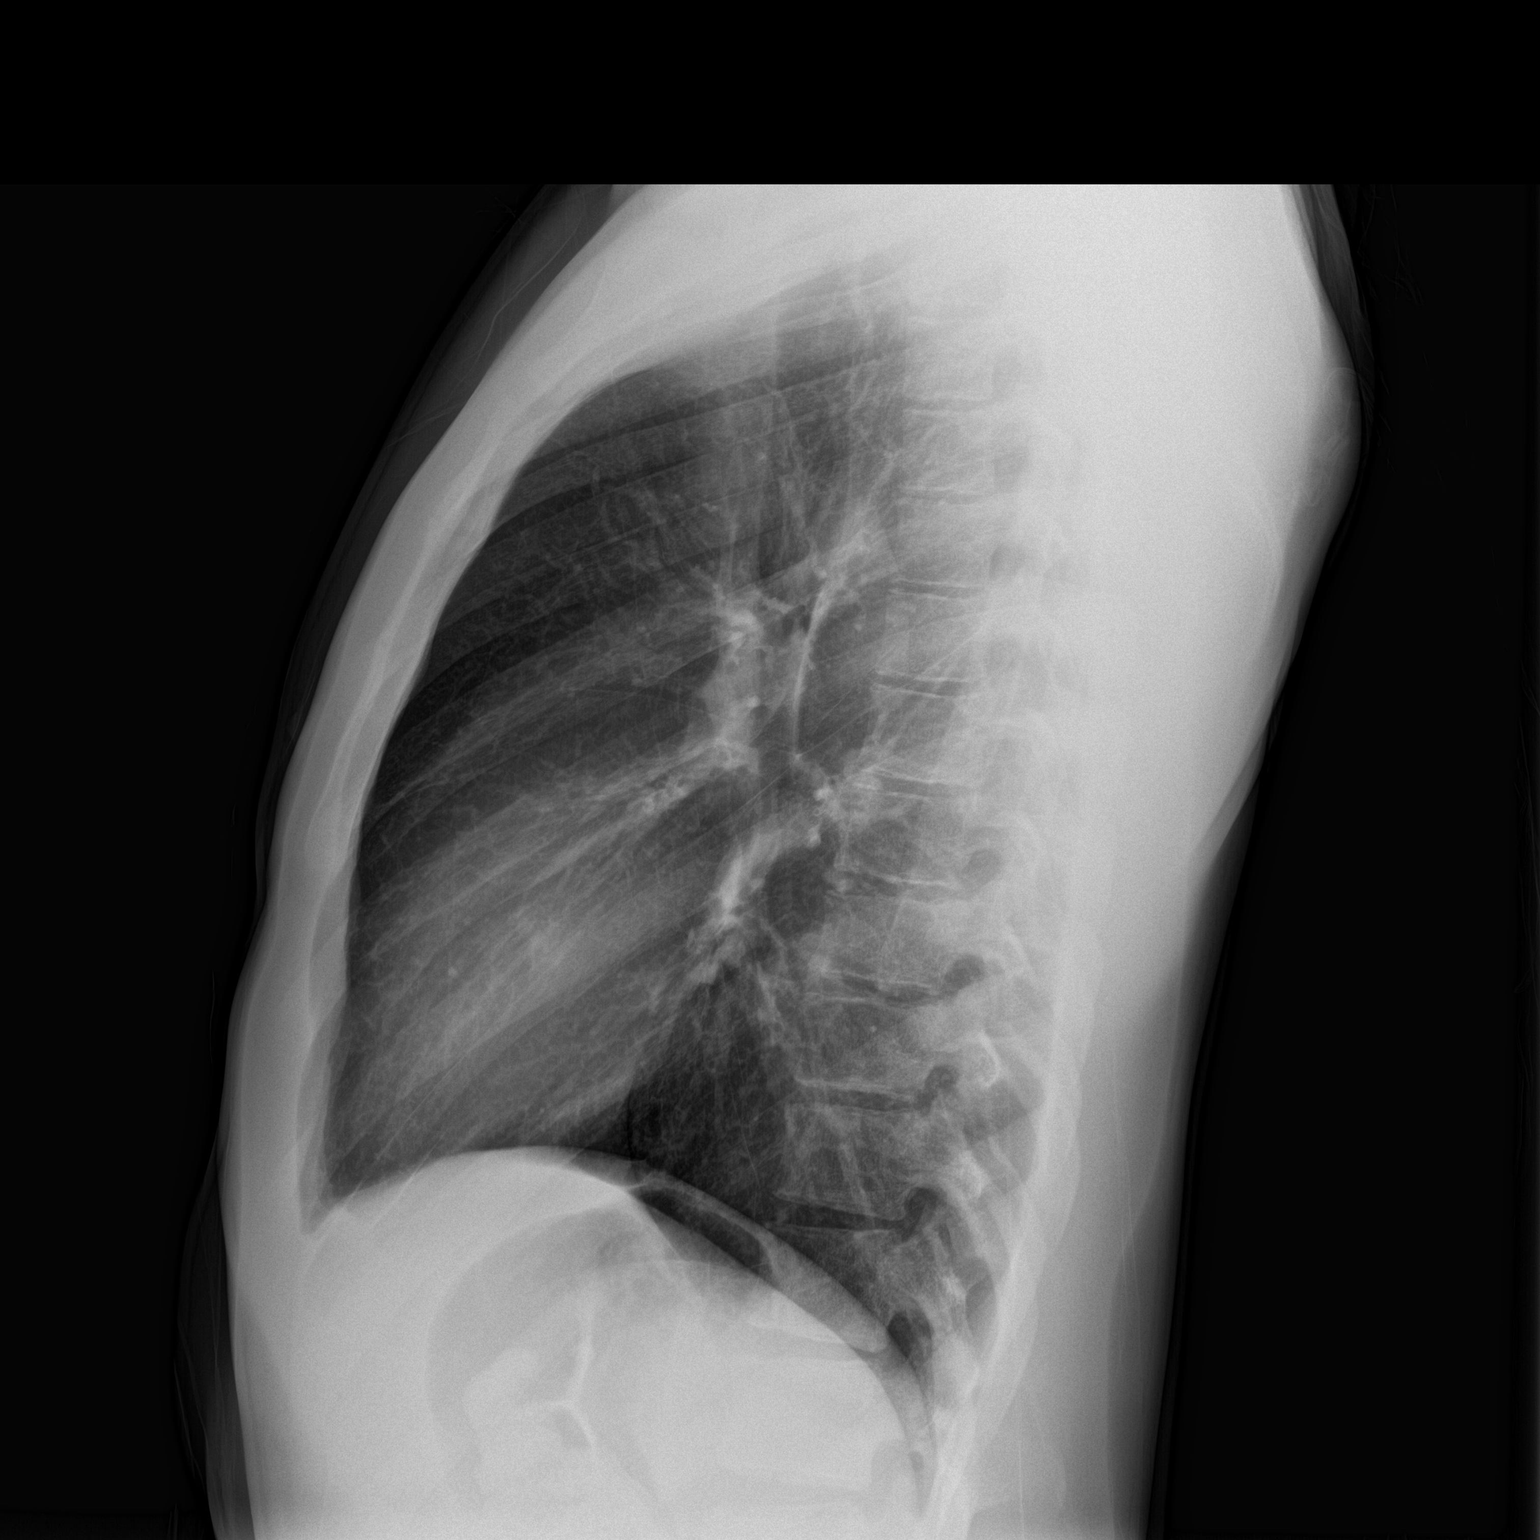

[2 of 2 positions shown; findings below may reference images not displayed]

FINDINGS: Normal heart size and mediastinal contours. No acute infiltrate or
edema. No effusion or pneumothorax. No acute osseous findings.
IMPRESSION: Normal chest.

## 2016-10-30 ENCOUNTER — Emergency Department (HOSPITAL_COMMUNITY)
Admission: EM | Admit: 2016-10-30 | Discharge: 2016-10-30 | Discharge status: Against Medical Advice | Payer: Medicaid Other

## 2016-10-30 NOTE — ED Notes (Signed)
Attempted to call patient for triage room but no answer.

## 2016-10-30 NOTE — ED Notes (Signed)
No answer when called for triage 

## 2017-01-31 ENCOUNTER — Encounter (HOSPITAL_COMMUNITY): Payer: Self-pay | Admitting: Emergency Medicine

## 2017-01-31 DIAGNOSIS — F17210 Nicotine dependence, cigarettes, uncomplicated: Secondary | ICD-10-CM | POA: Diagnosis not present

## 2017-01-31 DIAGNOSIS — Z202 Contact with and (suspected) exposure to infections with a predominantly sexual mode of transmission: Secondary | ICD-10-CM | POA: Insufficient documentation

## 2017-01-31 DIAGNOSIS — M545 Low back pain: Secondary | ICD-10-CM | POA: Insufficient documentation

## 2017-01-31 DIAGNOSIS — J45909 Unspecified asthma, uncomplicated: Secondary | ICD-10-CM | POA: Diagnosis not present

## 2017-01-31 DIAGNOSIS — Z79899 Other long term (current) drug therapy: Secondary | ICD-10-CM | POA: Diagnosis not present

## 2017-01-31 NOTE — ED Triage Notes (Signed)
Pt presents to ED for assessment of right lower back pain x 3 days.  Denies injury or recent heavy lifting or over-exertion.  Pt states pain radiates down his right leg sometime.  Pt ambulatory at triage, NAD.  Denies changes in urination.

## 2017-02-01 ENCOUNTER — Emergency Department (HOSPITAL_COMMUNITY)
Admission: EM | Admit: 2017-02-01 | Discharge: 2017-02-01 | Disposition: A | Discharge status: Home / Self Care | Payer: Medicaid Other | Attending: Emergency Medicine | Admitting: Emergency Medicine

## 2017-02-01 DIAGNOSIS — Z711 Person with feared health complaint in whom no diagnosis is made: Secondary | ICD-10-CM

## 2017-02-01 DIAGNOSIS — M545 Low back pain, unspecified: Secondary | ICD-10-CM

## 2017-02-01 LAB — URINALYSIS, ROUTINE W REFLEX MICROSCOPIC
Bilirubin Urine: NEGATIVE
Glucose, UA: NEGATIVE mg/dL
Hgb urine dipstick: NEGATIVE
Ketones, ur: 5 mg/dL — AB
Leukocytes, UA: NEGATIVE
Nitrite: NEGATIVE
Protein, ur: NEGATIVE mg/dL
Specific Gravity, Urine: 1.028 (ref 1.005–1.030)
pH: 6 (ref 5.0–8.0)

## 2017-02-01 LAB — GC/CHLAMYDIA PROBE AMP (~~LOC~~) NOT AT ARMC
Chlamydia: NEGATIVE
Neisseria Gonorrhea: NEGATIVE

## 2017-02-01 LAB — RPR: RPR Ser Ql: NONREACTIVE

## 2017-02-01 LAB — HIV ANTIBODY (ROUTINE TESTING W REFLEX): HIV Screen 4th Generation wRfx: NONREACTIVE

## 2017-02-01 MED ORDER — AZITHROMYCIN 250 MG PO TABS
2000.0000 mg | ORAL_TABLET | Freq: Once | ORAL | Status: AC
  Administered 2017-02-01: 2000 mg via ORAL
  Filled 2017-02-01: qty 8

## 2017-02-01 MED ORDER — ONDANSETRON 4 MG PO TBDP: 4.0000 mg | ORAL_TABLET | Freq: Three times a day (TID) | ORAL | 0 refills | Status: DC | PRN

## 2017-02-01 MED ORDER — ONDANSETRON 4 MG PO TBDP
8.0000 mg | ORAL_TABLET | Freq: Once | ORAL | Status: AC
  Administered 2017-02-01: 8 mg via ORAL
  Filled 2017-02-01: qty 2

## 2017-02-01 MED ORDER — AZITHROMYCIN 250 MG PO TABS: ORAL_TABLET | ORAL | 0 refills | Status: DC

## 2017-02-01 MED ORDER — GENTAMICIN SULFATE 40 MG/ML IJ SOLN
240.0000 mg | Freq: Once | INTRAMUSCULAR | Status: AC
  Administered 2017-02-01: 240 mg via INTRAMUSCULAR
  Filled 2017-02-01: qty 6

## 2017-02-01 NOTE — ED Notes (Signed)
Pt remains nauseous and vomiting.

## 2017-02-01 NOTE — ED Notes (Signed)
Pt sts his stomach hurts.

## 2017-02-01 NOTE — ED Notes (Signed)
Pt tolerating PO
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
## 2017-02-01 NOTE — Discharge Instructions (Signed)
Back Pain:  Your back pain should be treated with medicines such as ibuprofen or aleve and this back pain should get better over the next 2 weeks.  However if you develop severe or worsening pain, low back pain with fever, numbness, weakness or inability to walk or urinate, you should return to the ER immediately.  Please follow up with your doctor this week for a recheck if still having symptoms. Low back pain is discomfort in the lower back that may be due to injuries to muscles and ligaments around the spine.  Occasionally, it may be caused by a a problem to a part of the spine called a disc.  The pain may last several days or a week;  However, most patients get completely well in 4 weeks.  Self - care:  The application of heat can help soothe the pain.  Maintaining your daily activities, including walking, is encourged, as it will help you get better faster than just staying in bed.  Medications are also useful to help with pain control.  A commonly prescribed medications includes acetaminophen.  This medication is generally safe, though you should not take more than 8 of the extra strength (500mg ) pills a day.  Non steroidal anti inflammatory medications including Ibuprofen and naproxen;  These medications help both pain and swelling and are very useful in treating back pain.  They should be taken with food, as they can cause stomach upset, and more seriously, stomach bleeding.  You may take up to 600 mg of ibuprofen every 6 hours.  You will need to follow up with  Your primary healthcare provider in 1-2 weeks for reassessment.  Be aware that if you develop new symptoms, such as a fever, leg weakness, difficulty with or loss of control of your urine or bowels, abdominal pain, or more severe pain, you will need to seek medical attention and  / or return to the Emergency department.  [facility] to be screened for HIV in the future and for future STD  concerns or screenings. This is the recommendation by the CDC for people with multiple sexual partners or hx of STDs. You have been treated for gonorrhea and chlamydia in the ER but the hospital will call you if any lab work collected today was positive.

## 2017-02-01 NOTE — ED Provider Notes (Signed)
I performed a GC swab on this patient.  There was no discharge or testicular tenderness.  No mass or lesions.    Patient will be treated for gonorrhea and chlamydia.  Dorene GrebeMina states that she will follow-up on UA tomorrow and call in treatment for trich if necessary.  Patient vomited up the 2g Azithromycin.  No trich in urine.  Will prescribe 1 g dose of azithro to be taken with breakfast and with zofran.   No rash.  No wheezing.  No evidence of anaphylactic reaction.   Roxy HorsemanBrowning, Marcus Schwandt, PA-C 02/01/17 0354    Roxy HorsemanBrowning, Quamir Willemsen, PA-C 02/01/17 [id number]    Dione BoozeGlick, David, MD 02/01/17 [phone]

## 2017-02-01 NOTE — ED Notes (Signed)
Pt waiting for ride when he started vomiting.  Talked to PA who recommends zofran, scrip for azithromycin, and discharge.

## 2017-04-08 ENCOUNTER — Encounter (HOSPITAL_COMMUNITY): Payer: Self-pay

## 2017-04-08 ENCOUNTER — Emergency Department (HOSPITAL_COMMUNITY)
Admission: EM | Admit: 2017-04-08 | Discharge: 2017-04-08 | Disposition: A | Discharge status: Home / Self Care | Payer: Self-pay | Attending: Emergency Medicine | Admitting: Emergency Medicine

## 2017-04-08 DIAGNOSIS — Z5321 Procedure and treatment not carried out due to patient leaving prior to being seen by health care provider: Secondary | ICD-10-CM | POA: Insufficient documentation

## 2017-04-08 DIAGNOSIS — R6884 Jaw pain: Secondary | ICD-10-CM | POA: Insufficient documentation

## 2017-04-08 MED ORDER — IBUPROFEN 400 MG PO TABS
ORAL_TABLET | ORAL | Status: AC
  Filled 2017-04-08: qty 1

## 2017-04-08 MED ORDER — IBUPROFEN 400 MG PO TABS
400.0000 mg | ORAL_TABLET | Freq: Once | ORAL | Status: AC | PRN
  Administered 2017-04-08: 400 mg via ORAL

## 2017-04-08 NOTE — ED Notes (Signed)
Attempted to call pt X2 with no response.

## 2017-04-08 NOTE — ED Notes (Signed)
Called for pt. No answer. Checked all of waiting room and triage. Will d/c pt from system.
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
## 2017-04-11 IMAGING — CR DG TIBIA/FIBULA 2V*R*
4 series · 4 of 4 positions shown · non-contrast
Comparison: 12/28/2013

CLINICAL DATA: Right knee pain

EXAM:
RIGHT TIBIA AND FIBULA - 2 VIEW

[tibia ap (1 of 2)]
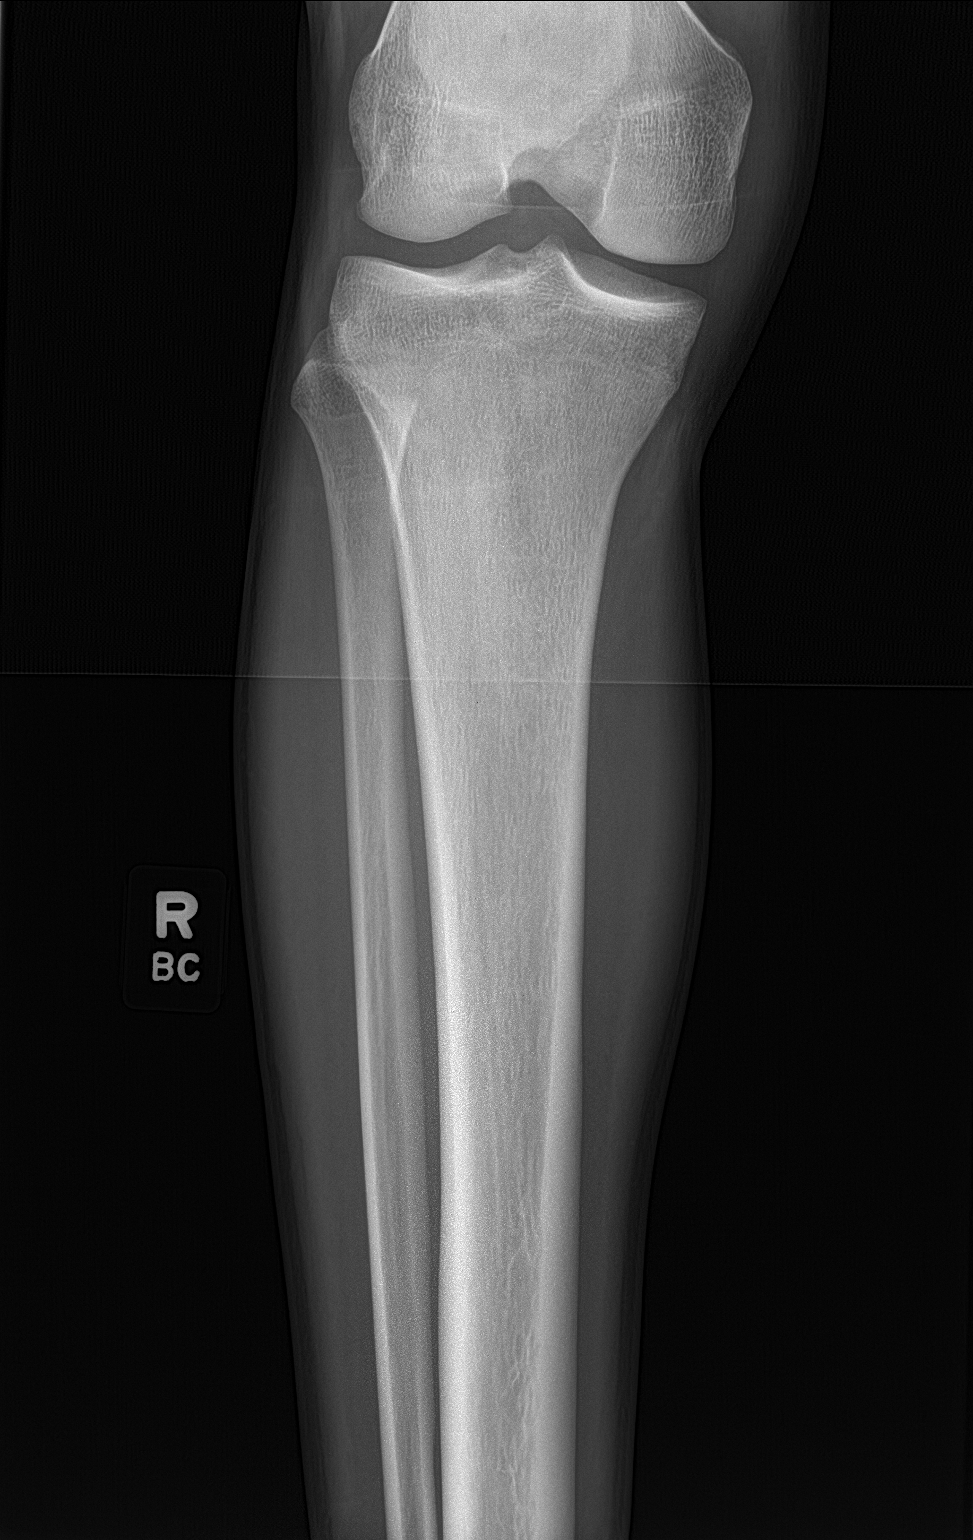

[tibia ap (2 of 2)]
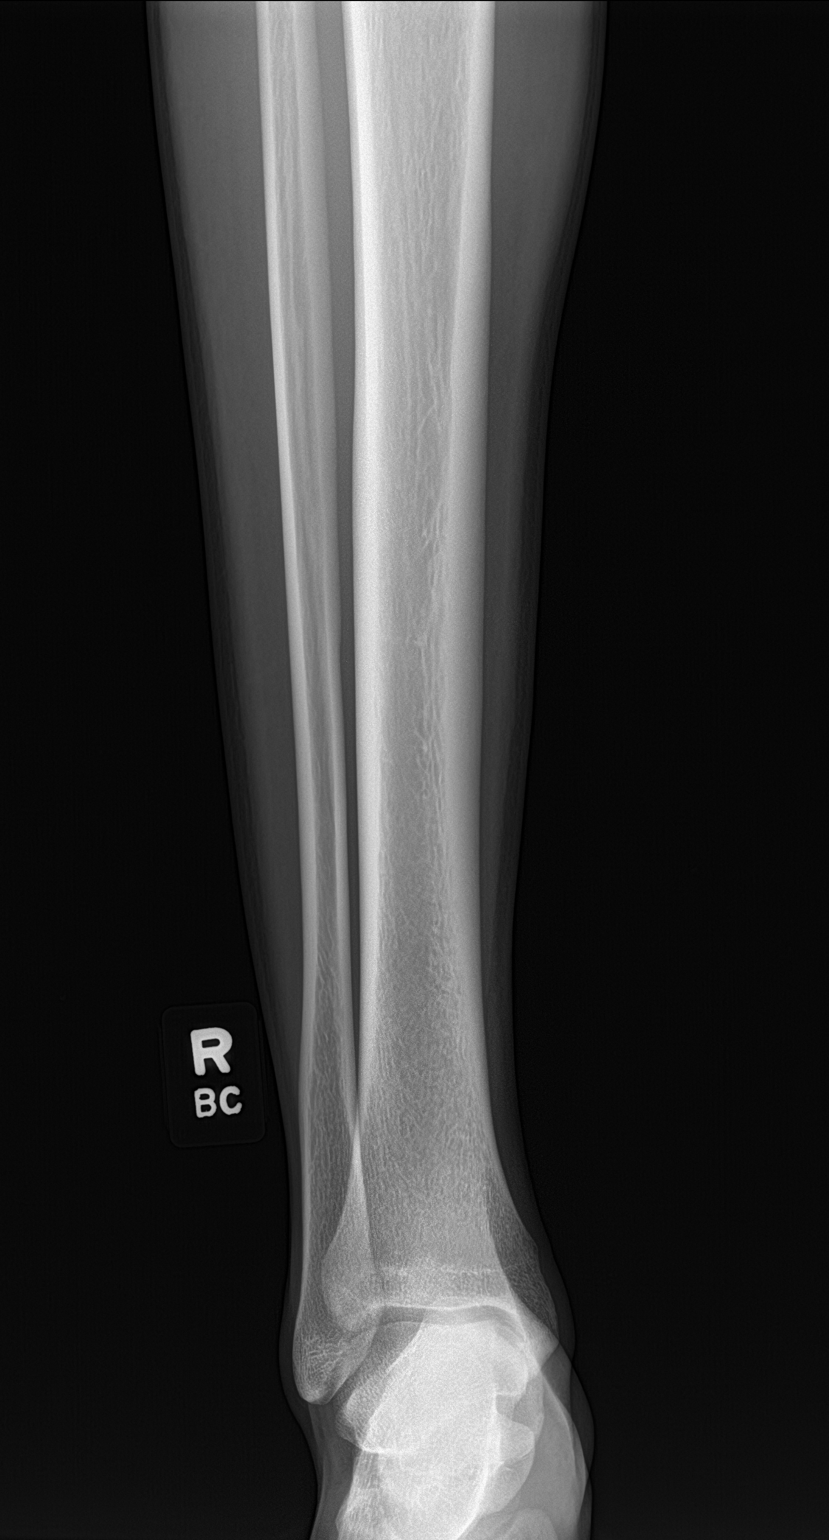

[tibia lat (1 of 2)]
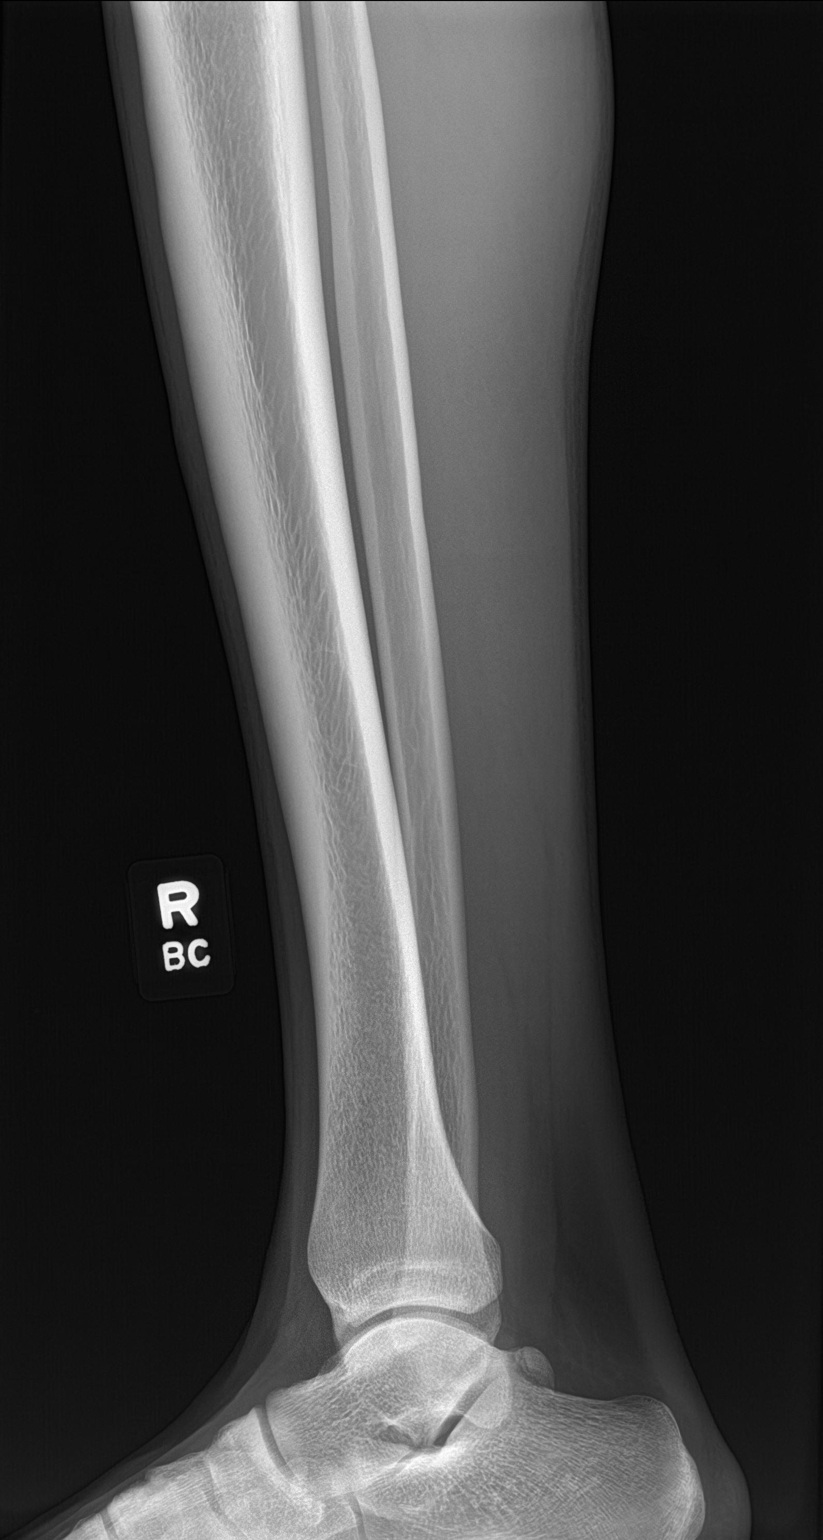

[tibia lat (2 of 2)]
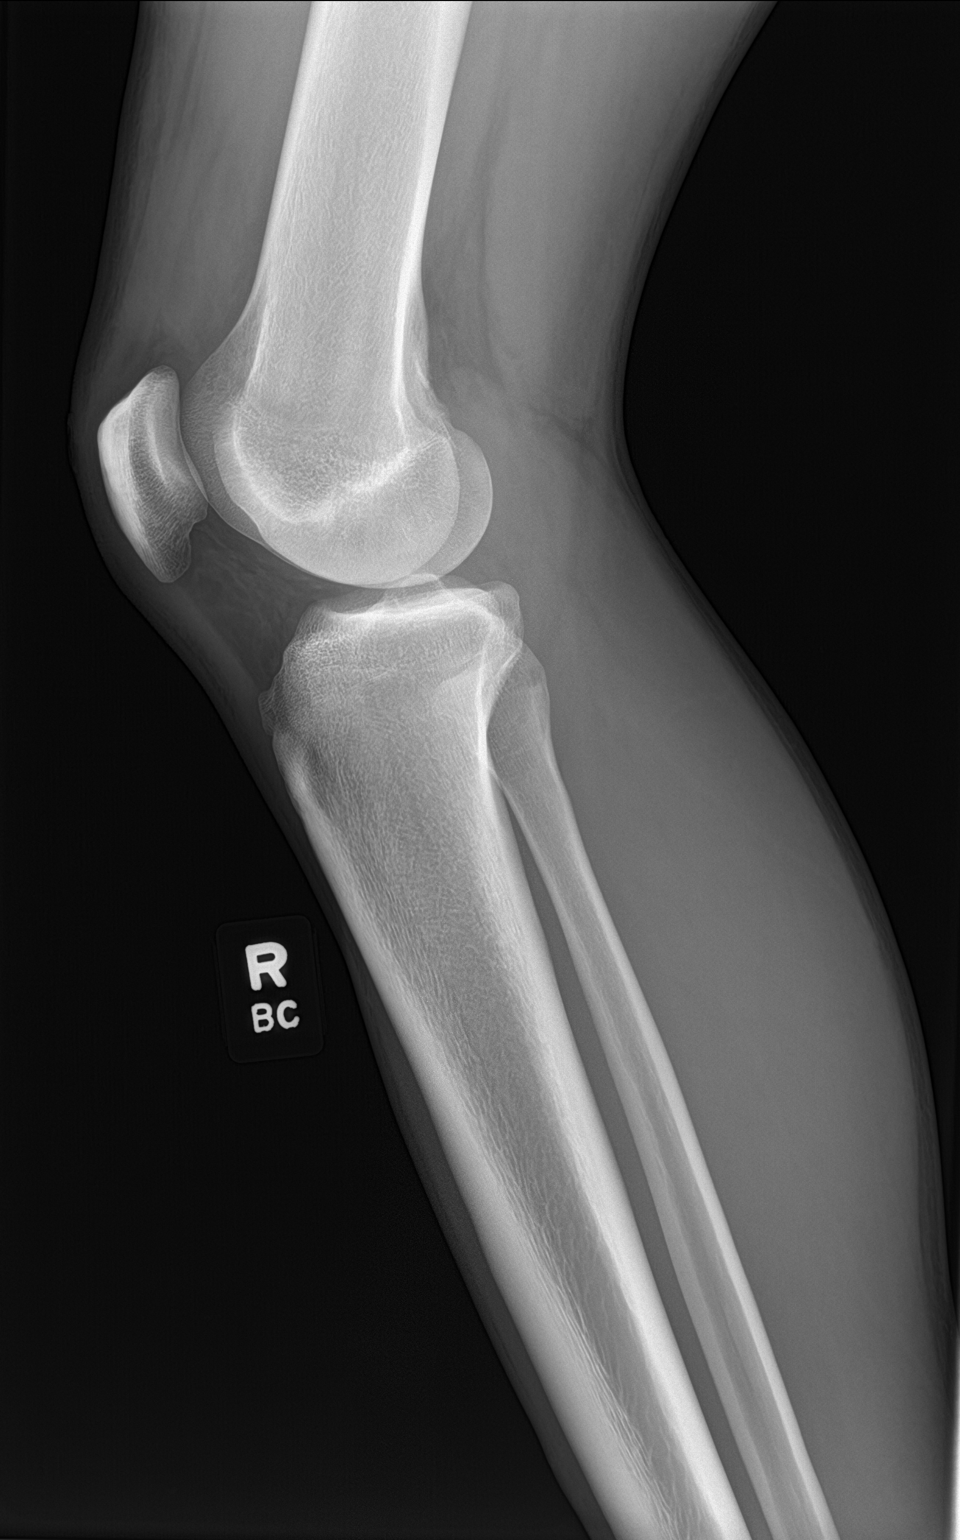

[4 of 4 positions shown; findings below may reference images not displayed]

FINDINGS: Four views of the right tibia-fibula submitted. No acute fracture or
subluxation. No periosteal reaction or bony erosion. No radiopaque
foreign body.
IMPRESSION: Negative.

## 2017-04-24 ENCOUNTER — Encounter (HOSPITAL_COMMUNITY): Payer: Self-pay | Admitting: Emergency Medicine

## 2017-04-24 ENCOUNTER — Emergency Department (HOSPITAL_COMMUNITY)
Admission: EM | Admit: 2017-04-24 | Discharge: 2017-04-24 | Disposition: A | Discharge status: Home / Self Care | Payer: Medicaid Other | Attending: Emergency Medicine | Admitting: Emergency Medicine

## 2017-04-24 ENCOUNTER — Emergency Department (HOSPITAL_COMMUNITY): Payer: Medicaid Other

## 2017-04-24 DIAGNOSIS — Y999 Unspecified external cause status: Secondary | ICD-10-CM | POA: Diagnosis not present

## 2017-04-24 DIAGNOSIS — J45909 Unspecified asthma, uncomplicated: Secondary | ICD-10-CM | POA: Insufficient documentation

## 2017-04-24 DIAGNOSIS — T148XXA Other injury of unspecified body region, initial encounter: Secondary | ICD-10-CM

## 2017-04-24 DIAGNOSIS — Z23 Encounter for immunization: Secondary | ICD-10-CM | POA: Diagnosis not present

## 2017-04-24 DIAGNOSIS — S4991XA Unspecified injury of right shoulder and upper arm, initial encounter: Secondary | ICD-10-CM | POA: Diagnosis present

## 2017-04-24 DIAGNOSIS — S1191XA Laceration without foreign body of unspecified part of neck, initial encounter: Secondary | ICD-10-CM

## 2017-04-24 DIAGNOSIS — Y929 Unspecified place or not applicable: Secondary | ICD-10-CM | POA: Diagnosis not present

## 2017-04-24 DIAGNOSIS — Y939 Activity, unspecified: Secondary | ICD-10-CM | POA: Diagnosis not present

## 2017-04-24 DIAGNOSIS — F17210 Nicotine dependence, cigarettes, uncomplicated: Secondary | ICD-10-CM | POA: Diagnosis not present

## 2017-04-24 DIAGNOSIS — S41011A Laceration without foreign body of right shoulder, initial encounter: Secondary | ICD-10-CM | POA: Diagnosis not present

## 2017-04-24 LAB — BASIC METABOLIC PANEL
Anion gap: 12 (ref 5–15)
BUN: 14 mg/dL (ref 6–20)
CO2: 24 mmol/L (ref 22–32)
CREATININE: 1.26 mg/dL — AB (ref 0.61–1.24)
Calcium: 9.5 mg/dL (ref 8.9–10.3)
Chloride: 105 mmol/L (ref 101–111)
GFR calc Af Amer: >60 mL/min (ref >60)
Glucose, Bld: 107 mg/dL — ABNORMAL HIGH (ref 65–99)
Potassium: 3.7 mmol/L (ref 3.5–5.1)
SODIUM: 141 mmol/L (ref 135–145)

## 2017-04-24 LAB — CBC WITH DIFFERENTIAL/PLATELET
Basophils Absolute: 0.1 10*3/uL (ref 0.0–0.1)
Basophils Relative: 2 %
EOS PCT: 8 %
Eosinophils Absolute: 0.7 10*3/uL (ref 0.0–0.7)
HCT: 40.7 % (ref 39.0–52.0)
Hemoglobin: 13.6 g/dL (ref 13.0–17.0)
Lymphocytes Relative: 37 %
Lymphs Abs: 3.4 10*3/uL (ref 0.7–4.0)
MCH: 28.6 pg (ref 26.0–34.0)
MCHC: 33.4 g/dL (ref 30.0–36.0)
MCV: 85.7 fL (ref 78.0–100.0)
Monocytes Absolute: 0.5 10*3/uL (ref 0.1–1.0)
Monocytes Relative: 6 %
Neutro Abs: 4.5 10*3/uL (ref 1.7–7.7)
Neutrophils Relative %: 47 %
PLATELETS: 313 10*3/uL (ref 150–400)
RBC: 4.75 MIL/uL (ref 4.22–5.81)
RDW: 14.5 % (ref 11.5–15.5)
WBC: 9.2 10*3/uL (ref 4.0–10.5)

## 2017-04-24 LAB — TYPE AND SCREEN
ABO/RH(D): B POS
ANTIBODY SCREEN: NEGATIVE

## 2017-04-24 LAB — ABO/RH: ABO/RH(D): B POS

## 2017-04-24 MED ORDER — ONDANSETRON HCL 4 MG/2ML IJ SOLN
4.0000 mg | Freq: Once | INTRAMUSCULAR | Status: AC
  Administered 2017-04-24: 4 mg via INTRAVENOUS
  Filled 2017-04-24: qty 2

## 2017-04-24 MED ORDER — TETANUS-DIPHTH-ACELL PERTUSSIS 5-2.5-18.5 LF-MCG/0.5 IM SUSP
0.5000 mL | Freq: Once | INTRAMUSCULAR | Status: AC
  Administered 2017-04-24: 0.5 mL via INTRAMUSCULAR
  Filled 2017-04-24: qty 0.5

## 2017-04-24 MED ORDER — MORPHINE SULFATE (PF) 4 MG/ML IV SOLN
4.0000 mg | Freq: Once | INTRAVENOUS | Status: AC
  Administered 2017-04-24: 4 mg via INTRAVENOUS
  Filled 2017-04-24: qty 1

## 2017-04-24 MED ORDER — SODIUM CHLORIDE 0.9 % IV BOLUS (SEPSIS)
1000.0000 mL | Freq: Once | INTRAVENOUS | Status: AC
  Administered 2017-04-24: 1000 mL via INTRAVENOUS

## 2017-04-24 MED ORDER — VALACYCLOVIR HCL 1 G PO TABS: 1000.0000 mg | ORAL_TABLET | Freq: Every day | ORAL | 1 refills | Status: AC

## 2017-04-24 MED ORDER — LIDOCAINE-EPINEPHRINE (PF) 2 %-1:200000 IJ SOLN
20.0000 mL | Freq: Once | INTRAMUSCULAR | Status: AC
  Administered 2017-04-24: 20 mL
  Filled 2017-04-24: qty 20

## 2017-04-24 NOTE — ED Notes (Signed)
Bed: WA09 Expected date:  Expected time:  Means of arrival:  Comments: 

## 2017-04-24 NOTE — Discharge Instructions (Signed)
Suture out in 10 days. You can shower tomorrow. Prescription for Valtrex.
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
## 2017-12-13 ENCOUNTER — Ambulatory Visit (HOSPITAL_COMMUNITY): Admission: EM | Admit: 2017-12-13 | Discharge: 2017-12-13 | Discharge status: Against Medical Advice | Payer: Self-pay

## 2017-12-13 NOTE — ED Notes (Signed)
Per pt access, pt left 

## 2017-12-14 ENCOUNTER — Emergency Department (HOSPITAL_COMMUNITY)
Admission: EM | Admit: 2017-12-14 | Discharge: 2017-12-14 | Disposition: A | Discharge status: Home / Self Care | Payer: Medicaid Other | Attending: Emergency Medicine | Admitting: Emergency Medicine

## 2017-12-14 ENCOUNTER — Other Ambulatory Visit: Payer: Self-pay

## 2017-12-14 DIAGNOSIS — J45909 Unspecified asthma, uncomplicated: Secondary | ICD-10-CM | POA: Insufficient documentation

## 2017-12-14 DIAGNOSIS — T39391A Poisoning by other nonsteroidal anti-inflammatory drugs [NSAID], accidental (unintentional), initial encounter: Secondary | ICD-10-CM | POA: Insufficient documentation

## 2017-12-14 DIAGNOSIS — F17210 Nicotine dependence, cigarettes, uncomplicated: Secondary | ICD-10-CM | POA: Insufficient documentation

## 2017-12-14 DIAGNOSIS — Z79899 Other long term (current) drug therapy: Secondary | ICD-10-CM | POA: Insufficient documentation

## 2017-12-14 LAB — CBC
HCT: 42.9 % (ref 39.0–52.0)
Hemoglobin: 14.3 g/dL (ref 13.0–17.0)
MCH: 28.8 pg (ref 26.0–34.0)
MCHC: 33.3 g/dL (ref 30.0–36.0)
MCV: 86.5 fL (ref 78.0–100.0)
PLATELETS: 306 10*3/uL (ref 150–400)
RBC: 4.96 MIL/uL (ref 4.22–5.81)
RDW: 14.6 % (ref 11.5–15.5)
WBC: 9 10*3/uL (ref 4.0–10.5)

## 2017-12-14 LAB — COMPREHENSIVE METABOLIC PANEL
ALK PHOS: 76 U/L (ref 38–126)
ALT: 55 U/L (ref 17–63)
ANION GAP: 9 (ref 5–15)
AST: 247 U/L — ABNORMAL HIGH (ref 15–41)
Albumin: 4.1 g/dL (ref 3.5–5.0)
BUN: 10 mg/dL (ref 6–20)
CALCIUM: 9.4 mg/dL (ref 8.9–10.3)
CO2: 26 mmol/L (ref 22–32)
CREATININE: 1.25 mg/dL — AB (ref 0.61–1.24)
Chloride: 105 mmol/L (ref 101–111)
Glucose, Bld: 78 mg/dL (ref 65–99)
Potassium: 4.5 mmol/L (ref 3.5–5.1)
Sodium: 140 mmol/L (ref 135–145)
Total Bilirubin: 0.7 mg/dL (ref 0.3–1.2)
Total Protein: 7 g/dL (ref 6.5–8.1)

## 2017-12-14 LAB — ETHANOL

## 2017-12-14 LAB — CBG MONITORING, ED: GLUCOSE-CAPILLARY: 88 mg/dL (ref 65–99)

## 2017-12-14 MED ORDER — ONDANSETRON HCL 4 MG/2ML IJ SOLN
4.0000 mg | Freq: Once | INTRAMUSCULAR | Status: AC
  Administered 2017-12-14: 4 mg via INTRAVENOUS
  Filled 2017-12-14: qty 2

## 2017-12-14 NOTE — ED Notes (Signed)
Pt wishes to leave AMA. EDPA aware.  Talked to pt. Pt provided with paper scrubs and left when nurse was not in room.

## 2017-12-14 NOTE — ED Triage Notes (Signed)
Pt was at home and had stated he had taken several Zanax. He stated the first few did not work so he took more.He was brought in by POV. Pt was very lethargic and out of it. Pt was responding to sternal rub

## 2017-12-14 NOTE — ED Provider Notes (Signed)
[facility] EMERGENCY DEPARTMENT Provider Note   CSN: [id number] Arrival date & time: 12/14/17  0232     History   Chief Complaint Chief Complaint  Patient presents with  . Drug Overdose    HPI Jesse Dean is a 22 y.o. male presenting for evaluation of ingestion.   Patient states that he took a lot of pills this evening, he is not sure what they are.  Initially, he told people that he took Xanax.  When talking to family member later, they state that he thought he was taking Xanax, but he actually took ibuprofen.  They state he took 10-11 ibuprofen tablets.  When confirmed with patient, he confirms that yes, it was ibuprofen.  He took a Xanax at 4:00 this afternoon, but no Xanax since.  He denies other drug use.  He denies alcohol use.  Friend states she brought him in because he was complaining of chest pain.  He complains of no chest pain in the ER, but reports some nausea and epigastric burning.  He did not take these pills in an attempt to kill himself or hurt himself, stating that he was just trying to get to sleep.  He denies current chest pain, shortness of breath, abdominal pain.  No other medical problems, does not take medications daily.  HPI  Past Medical History:  Diagnosis Date  . Asthma   . Subarachnoid hemorrhage [facility])     Patient Active Problem List   Diagnosis Date Noted  . 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 KNEE SURGERY    . PENIS REVASCULARIZATION SURGERY    . STOMACH SURGERY          Home Medications    Prior to Admission medications   Medication Sig Start Date End Date Taking? Authorizing Provider  albuterol (PROVENTIL HFA;VENTOLIN HFA) 108 (90 BASE) MCG/ACT inhaler Inhale 2 puffs into the lungs every 6 (six) hours as needed for wheezing.    [provider]  ondansetron  (ZOFRAN ODT) 4 MG disintegrating tablet Take 1 tablet (4 mg total) by mouth every 8 (eight) hours as needed for nausea or vomiting. Patient not taking: Reported on 04/24/2017 02/01/17   Roxy Horseman, PA-C    Family History No family history on file.  Social History Social History   Tobacco Use  . Smoking status: Current Every Day Smoker    Packs/day: 0.50    Types: Cigarettes  . Smokeless tobacco: Never Used  Substance Use Topics  . Alcohol use: No    Comment: occasionally  . Drug use: No    Types: Marijuana     Allergies   Penicillins   Review of Systems Review of Systems  Gastrointestinal: Positive for nausea.  All other systems reviewed and are negative.    Physical Exam Updated Vital Signs BP 114/75   Pulse 73   Resp 17   Ht  (1.854 m)   Wt 72.6 kg (160 lb)   SpO2 98%   BMI 21.11 kg/m   Physical Exam  Constitutional: He is oriented to person, place, and time. He appears well-developed and well-nourished. No distress.  Pt initially tired, but alert and oriented. No longer lethargic  HENT:  Head: Normocephalic and atraumatic.  Nose: Nose normal.  Mouth/Throat: Uvula is midline, oropharynx is clear and moist and mucous membranes are normal.  Eyes: Pupils are  equal, round, and reactive to light. Conjunctivae, EOM and lids are normal.  Neck: Normal range of motion.  Cardiovascular: Normal rate, regular rhythm and intact distal pulses.  Pulmonary/Chest: Effort normal and breath sounds normal. No respiratory distress. He has no wheezes.  Speaking full sentences.  Clear lung sounds in all fields.  Abdominal: Soft. He exhibits no distension and no mass. There is no tenderness. There is no guarding.  Musculoskeletal: Normal range of motion.  Neurological: He is alert and oriented to person, place, and time.  Alert and oriented.  Follows commands.  Strength intact x4.  Sensation intact x4.  Radial and pedal pulses equal bilaterally.  Skin: Skin is warm and  dry.  Psychiatric: He has a normal mood and affect.  Nursing note and vitals reviewed.    ED Treatments / Results  Labs (all labs ordered are listed, but only abnormal results are displayed) Labs Reviewed  COMPREHENSIVE METABOLIC PANEL - Abnormal; Notable for the following components:      Result Value   Creatinine, Ser 1.25 (*)    AST 247 (*)    All other components within normal limits  CBC  ETHANOL  RAPID URINE DRUG SCREEN, HOSP PERFORMED  CBG MONITORING, ED    EKG None  Radiology No results found.  Procedures Procedures (including critical care time)  Medications Ordered in ED Medications  ondansetron (ZOFRAN) injection 4 mg (4 mg Intravenous Given 12/14/17 0305)     Initial Impression / Assessment and Plan / ED Course  I have reviewed the triage vital signs and the nursing notes.  Pertinent labs & imaging results that were available during my care of the patient were reviewed by me and considered in my medical decision making (see chart for details).     Patient presenting for evaluation post ingestion of pills.  Initially, patient states he took Xanax, but later changed his story to ibuprofen.  Initially patient was minimally responsive.  Upon exam, patient alert and oriented and answering questions.  In no distress.  Will obtain labs, urine, and monitor. Zofran for sx control. Case discussed with attending, Dr. Patria Mane evaluated the pt.   Labs show stable creatinine, elevated AST. Otherwise reassuring. On reevaluation, pt states his sxs have resolved. No complaints at this time. He is alert and oriented. Pt able to confirm that he took the ibuprofen at 11:03 PM. Will obtain salicylate and acetaminophen levels.   Pt states that he wants to leave without waiting for his labs to finish. Dicussed with patient that he could have life threatening illness and could be at risk for liver failure. Pt state he understands and still wants to leave AMA.    Final Clinical  Impressions(s) / ED Diagnoses   Final diagnoses:  Overdose of nonsteroidal anti-inflammatory drug (NSAID), accidental or unintentional, initial encounter    ED Discharge Orders    None       Alveria Apley, PA-C 12/14/17 0730    Azalia Bilis, MD 12/14/17 1012

## 2018-02-09 ENCOUNTER — Ambulatory Visit (HOSPITAL_COMMUNITY)
Admission: EM | Admit: 2018-02-09 | Discharge: 2018-02-09 | Disposition: A | Discharge status: Home / Self Care | Payer: Medicaid Other | Attending: Family Medicine | Admitting: Family Medicine

## 2018-02-09 ENCOUNTER — Encounter (HOSPITAL_COMMUNITY): Payer: Self-pay | Admitting: Emergency Medicine

## 2018-02-09 ENCOUNTER — Other Ambulatory Visit: Payer: Self-pay

## 2018-02-09 DIAGNOSIS — R369 Urethral discharge, unspecified: Secondary | ICD-10-CM | POA: Diagnosis not present

## 2018-02-09 DIAGNOSIS — A549 Gonococcal infection, unspecified: Secondary | ICD-10-CM

## 2018-02-09 DIAGNOSIS — Z113 Encounter for screening for infections with a predominantly sexual mode of transmission: Secondary | ICD-10-CM

## 2018-02-09 DIAGNOSIS — Z711 Person with feared health complaint in whom no diagnosis is made: Secondary | ICD-10-CM

## 2018-02-09 DIAGNOSIS — F17210 Nicotine dependence, cigarettes, uncomplicated: Secondary | ICD-10-CM | POA: Insufficient documentation

## 2018-02-09 DIAGNOSIS — Z79899 Other long term (current) drug therapy: Secondary | ICD-10-CM | POA: Insufficient documentation

## 2018-02-09 DIAGNOSIS — Z202 Contact with and (suspected) exposure to infections with a predominantly sexual mode of transmission: Secondary | ICD-10-CM | POA: Diagnosis not present

## 2018-02-09 DIAGNOSIS — J45909 Unspecified asthma, uncomplicated: Secondary | ICD-10-CM | POA: Insufficient documentation

## 2018-02-09 DIAGNOSIS — Z880 Allergy status to penicillin: Secondary | ICD-10-CM | POA: Insufficient documentation

## 2018-02-09 MED ORDER — AZITHROMYCIN 250 MG PO TABS
ORAL_TABLET | ORAL | Status: AC
Start: 2018-02-09 — End: 2018-02-09
  Filled 2018-02-09: qty 4

## 2018-02-09 MED ORDER — GENTAMICIN SULFATE 40 MG/ML IJ SOLN
240.0000 mg | Freq: Once | INTRAMUSCULAR | Status: AC
  Administered 2018-02-09: 240 mg via INTRAMUSCULAR
  Filled 2018-02-09: qty 6

## 2018-02-09 MED ORDER — METRONIDAZOLE 500 MG PO TABS
ORAL_TABLET | ORAL | Status: AC
  Filled 2018-02-09: qty 4

## 2018-02-09 MED ORDER — METRONIDAZOLE 500 MG PO TABS
2000.0000 mg | ORAL_TABLET | Freq: Once | ORAL | Status: AC
  Administered 2018-02-09: 2000 mg via ORAL

## 2018-02-09 MED ORDER — AZITHROMYCIN 250 MG PO TABS
1000.0000 mg | ORAL_TABLET | Freq: Once | ORAL | Status: AC
  Administered 2018-02-09: 1000 mg via ORAL

## 2018-02-09 NOTE — ED Notes (Signed)
Sent for a dirty urine 

## 2018-02-09 NOTE — ED Notes (Signed)
Medicines coming from main pharmacy

## 2018-02-09 NOTE — ED Triage Notes (Signed)
Patient reports penile discharge started this morning.  Patient has penile irritation.  History of the same

## 2018-02-09 NOTE — Discharge Instructions (Signed)
You were treated empirically for gonorrhea, chlamydia, trichomonas.  Flagyl 2000 mg, azithromycin 1g by mouth and gentamicin 240 mg injection given in office today. Cytology sent, you will be contacted with any positive results that requires further treatment. Refrain from sexual activity and alcohol use for the next 7 days. Monitor for any worsening of symptoms, fever, abdominal pain, nausea, vomiting, penile lesion, testicular swelling/pain to follow up for reevaluation.

## 2018-02-09 NOTE — ED Provider Notes (Signed)
[facility]    CSN: [id number] Arrival date & time: 02/09/18  1526     History   Chief Complaint Chief Complaint  Patient presents with  . SEXUALLY TRANSMITTED DISEASE    HPI Jesse Dean is a 22 y.o. male.   22 year old male comes in for penile discharge with irritation that started this morning.  Denies urinary symptoms such as frequency, dysuria, hematuria.  Denies fever, chills, night sweats.  Denies abdominal pain, nausea, vomiting.  Denies penile lesion/sore, testicular swelling/pain.  Jesse Dean is sexually active with 2 male partners, no condom use.     Past Medical History:  Diagnosis Date  . Asthma   . Subarachnoid hemorrhage [facility](HCC)     Patient Active Problem List   Diagnosis Date Noted  . 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 KNEE SURGERY    . PENIS REVASCULARIZATION SURGERY    . STOMACH SURGERY         Home Medications    Prior to Admission medications   Medication Sig Start Date End Date Taking? Authorizing Provider  albuterol (PROVENTIL HFA;VENTOLIN HFA) 108 (90 BASE) MCG/ACT inhaler Inhale 2 puffs into the lungs every 6 (six) hours as needed for wheezing.    [provider]    Family History History reviewed. No pertinent family history.  Social History Social History   Tobacco Use  . Smoking status: Current Every Day Smoker    Packs/day: 0.50    Types: Cigarettes  . Smokeless tobacco: Never Used  Substance Use Topics  . Alcohol use: No    Comment: occasionally  . Drug use: No    Types: Marijuana     Allergies   Penicillins   Review of Systems Review of Systems  Reason unable to perform ROS: See HPI as above.     Physical Exam Triage Vital Signs ED Triage Vitals  Enc Vitals Group     BP 02/09/18 1544 116/63     Pulse Rate 02/09/18 1544 (!) 59     Resp  02/09/18 1544 18     Temp 02/09/18 1544 98.2 F (36.8 C)     Temp Source 02/09/18 1544 Oral     SpO2 02/09/18 1544 100 %     Weight --      Height --      Head Circumference --      Peak Flow --      Pain Score 02/09/18 1542 6     Pain Loc --      Pain Edu? --      Excl. in GC? --    No data found.  Updated Vital Signs BP 116/63 (BP Location: Left Arm)   Pulse (!) 59   Temp 98.2 F (36.8 C) (Oral)   Resp 18   SpO2 100%    Physical Exam  Constitutional: Jesse Dean is oriented to person, place, and time. Jesse Dean appears well-developed and well-nourished. No distress.  HENT:  Head: Normocephalic and atraumatic.  Eyes: Pupils are equal, round, and reactive to light. Conjunctivae are normal.  Neurological: Jesse Dean is alert and oriented to person, place, and time.  Skin: Jesse Dean is not diaphoretic.     UC Treatments / Results  Labs (all labs ordered are listed, but only abnormal results are displayed) Labs Reviewed  URINE CYTOLOGY ANCILLARY ONLY    EKG None  Radiology No results found.  Procedures Procedures (including critical care time)  Medications Ordered in UC Medications  gentamicin (GARAMYCIN) injection 240 mg (240 mg Intramuscular Given 02/09/18 1646)  azithromycin (ZITHROMAX) tablet 1,000 mg (1,000 mg Oral Given 02/09/18 1646)  metroNIDAZOLE (FLAGYL) tablet 2,000 mg (2,000 mg Oral Given 02/09/18 1646)    Initial Impression / Assessment and Plan / UC Course  I have reviewed the triage vital signs and the nursing notes.  Pertinent labs & imaging results that were available during my care of the patient were reviewed by me and considered in my medical decision making (see chart for details).    Patient would like to be treated empirically for gonorrhea, chlamydia, trichomonas.  Patient with anaphylactic allergy to penicillin. Gentamicin 240mg  IM, flagyl 2g PO, azithromycin 1g PO given in office today. Cytology sent, patient will be contacted with any positive results that require  additional treatment. Patient to refrain from sexual activity for the next 7 days. Return precautions given.   Final Clinical Impressions(s) / UC Diagnoses   Final diagnoses:  Concern about STD in male without diagnosis    ED Prescriptions    None        Lurline Idol 02/09/18 1905

## 2018-02-10 LAB — URINE CYTOLOGY ANCILLARY ONLY
Chlamydia: NEGATIVE
NEISSERIA GONORRHEA: POSITIVE — AB
Trichomonas: NEGATIVE

## 2018-02-11 ENCOUNTER — Telehealth (HOSPITAL_COMMUNITY): Payer: Self-pay

## 2018-02-11 NOTE — Telephone Encounter (Signed)
Test for gonorrhea was positive. This was treated at the urgent care visit with IM Gentamycin 240 mg. and po zithromax 1g. Need to instruct to patient to refrain from sexual intercourse for 7 days after treatment to give the medicine time to work. Sexual partners need to be notified and tested/treated. Condoms may reduce risk of reinfection. Recheck or followup with PCP for further evaluation if symptoms are not improving.  [facility] notified. No answer at this time. Voicemail left.

## 2018-02-13 ENCOUNTER — Telehealth (HOSPITAL_COMMUNITY): Payer: Self-pay

## 2018-02-13 NOTE — Telephone Encounter (Signed)
Attempted to reach patient x 2 

## 2018-02-17 ENCOUNTER — Telehealth (HOSPITAL_COMMUNITY): Payer: Self-pay

## 2018-02-17 NOTE — Telephone Encounter (Signed)
Attempted to reach patient x 3, letter sent.

## 2018-03-01 ENCOUNTER — Telehealth (HOSPITAL_COMMUNITY): Payer: Self-pay

## 2018-03-01 NOTE — Telephone Encounter (Signed)
Letter was sent back to urgent care that was sent regarding test results.

## 2019-01-11 ENCOUNTER — Emergency Department (HOSPITAL_COMMUNITY): Payer: Self-pay

## 2019-01-11 ENCOUNTER — Encounter (HOSPITAL_COMMUNITY): Payer: Self-pay | Admitting: Emergency Medicine

## 2019-01-11 ENCOUNTER — Other Ambulatory Visit: Payer: Self-pay

## 2019-01-11 ENCOUNTER — Emergency Department (HOSPITAL_COMMUNITY)
Admission: EM | Admit: 2019-01-11 | Discharge: 2019-01-12 | Disposition: A | Discharge status: Home / Self Care | Payer: Self-pay | Attending: Emergency Medicine | Admitting: Emergency Medicine

## 2019-01-11 DIAGNOSIS — Y92410 Unspecified street and highway as the place of occurrence of the external cause: Secondary | ICD-10-CM | POA: Insufficient documentation

## 2019-01-11 DIAGNOSIS — S40811A Abrasion of right upper arm, initial encounter: Secondary | ICD-10-CM | POA: Insufficient documentation

## 2019-01-11 DIAGNOSIS — S20211A Contusion of right front wall of thorax, initial encounter: Secondary | ICD-10-CM | POA: Insufficient documentation

## 2019-01-11 DIAGNOSIS — N289 Disorder of kidney and ureter, unspecified: Secondary | ICD-10-CM | POA: Insufficient documentation

## 2019-01-11 DIAGNOSIS — Z20828 Contact with and (suspected) exposure to other viral communicable diseases: Secondary | ICD-10-CM | POA: Insufficient documentation

## 2019-01-11 DIAGNOSIS — Y998 Other external cause status: Secondary | ICD-10-CM | POA: Insufficient documentation

## 2019-01-11 DIAGNOSIS — F17210 Nicotine dependence, cigarettes, uncomplicated: Secondary | ICD-10-CM | POA: Insufficient documentation

## 2019-01-11 DIAGNOSIS — J45909 Unspecified asthma, uncomplicated: Secondary | ICD-10-CM | POA: Insufficient documentation

## 2019-01-11 DIAGNOSIS — Y9301 Activity, walking, marching and hiking: Secondary | ICD-10-CM | POA: Insufficient documentation

## 2019-01-11 LAB — I-STAT CHEM 8, ED
BUN: 13 mg/dL (ref 6–20)
Calcium, Ion: 1.05 mmol/L — ABNORMAL LOW (ref 1.15–1.40)
Chloride: 108 mmol/L (ref 98–111)
Creatinine, Ser: 1.3 mg/dL — ABNORMAL HIGH (ref 0.61–1.24)
Glucose, Bld: 81 mg/dL (ref 70–99)
HCT: 42 % (ref 39.0–52.0)
Hemoglobin: 14.3 g/dL (ref 13.0–17.0)
Potassium: 4 mmol/L (ref 3.5–5.1)
Sodium: 140 mmol/L (ref 135–145)
TCO2: 26 mmol/L (ref 22–32)

## 2019-01-11 LAB — LACTIC ACID, PLASMA: Lactic Acid, Venous: 1.1 mmol/L (ref 0.5–1.9)

## 2019-01-11 LAB — CBC
HCT: 39.5 % (ref 39.0–52.0)
Hemoglobin: 13.4 g/dL (ref 13.0–17.0)
MCH: 29.1 pg (ref 26.0–34.0)
MCHC: 33.9 g/dL (ref 30.0–36.0)
MCV: 85.9 fL (ref 80.0–100.0)
Platelets: 309 10*3/uL (ref 150–400)
RBC: 4.6 MIL/uL (ref 4.22–5.81)
RDW: 13.8 % (ref 11.5–15.5)
WBC: 17.1 10*3/uL — ABNORMAL HIGH (ref 4.0–10.5)
nRBC: 0 % (ref 0.0–0.2)

## 2019-01-11 LAB — COMPREHENSIVE METABOLIC PANEL
ALT: 13 U/L (ref 0–44)
AST: 22 U/L (ref 15–41)
Albumin: 4.2 g/dL (ref 3.5–5.0)
Alkaline Phosphatase: 71 U/L (ref 38–126)
Anion gap: 10 (ref 5–15)
BUN: 12 mg/dL (ref 6–20)
CO2: 23 mmol/L (ref 22–32)
Calcium: 9.1 mg/dL (ref 8.9–10.3)
Chloride: 108 mmol/L (ref 98–111)
Creatinine, Ser: 1.33 mg/dL — ABNORMAL HIGH (ref 0.61–1.24)
GFR calc Af Amer: >60 mL/min (ref >60)
GFR calc non Af Amer: >60 mL/min (ref >60)
Glucose, Bld: 83 mg/dL (ref 70–99)
Potassium: 4 mmol/L (ref 3.5–5.1)
Sodium: 141 mmol/L (ref 135–145)
Total Bilirubin: 0.8 mg/dL (ref 0.3–1.2)
Total Protein: 7.3 g/dL (ref 6.5–8.1)

## 2019-01-11 LAB — ETHANOL: Alcohol, Ethyl (B): <10 mg/dL (ref <10)

## 2019-01-11 LAB — SAMPLE TO BLOOD BANK

## 2019-01-11 LAB — PROTIME-INR
INR: 1.1 (ref 0.8–1.2)
Prothrombin Time: 14.4 seconds (ref 11.4–15.2)

## 2019-01-11 MED ORDER — FENTANYL CITRATE (PF) 100 MCG/2ML IJ SOLN
75.0000 ug | Freq: Once | INTRAMUSCULAR | Status: AC
  Administered 2019-01-11: 23:00:00 75 ug via INTRAVENOUS
  Filled 2019-01-11: qty 2
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
## 2019-01-12 ENCOUNTER — Emergency Department (HOSPITAL_COMMUNITY): Payer: Self-pay

## 2019-01-12 LAB — SARS CORONAVIRUS 2 BY RT PCR (HOSPITAL ORDER, PERFORMED IN ~~LOC~~ HOSPITAL LAB): SARS Coronavirus 2: NEGATIVE

## 2019-01-12 LAB — CDS SEROLOGY

## 2019-01-12 MED ORDER — IOHEXOL 300 MG/ML  SOLN
100.0000 mL | Freq: Once | INTRAMUSCULAR | Status: AC | PRN
  Administered 2019-01-12: 02:00:00 100 mL via INTRAVENOUS
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
## 2019-01-12 NOTE — Discharge Instructions (Addendum)
Your x-rays and CT scans did not show any evidence of any serious injury.  Apply ice to all areas that are sore or painful.  Apply for 30 minutes at a time, 3-4 times a day.  Take acetaminophen as needed for pain.  Do not take ibuprofen or naproxen because you have some kidney problems which could get worse if you take them.  Return if you are having any problems.

## 2019-05-11 ENCOUNTER — Emergency Department (HOSPITAL_COMMUNITY): Payer: Self-pay

## 2019-05-11 ENCOUNTER — Other Ambulatory Visit: Payer: Self-pay

## 2019-05-11 ENCOUNTER — Encounter (HOSPITAL_COMMUNITY): Payer: Self-pay

## 2019-05-11 ENCOUNTER — Emergency Department (HOSPITAL_COMMUNITY)
Admission: EM | Admit: 2019-05-11 | Discharge: 2019-05-11 | Discharge status: Against Medical Advice | Payer: Self-pay | Attending: Emergency Medicine | Admitting: Emergency Medicine

## 2019-05-11 DIAGNOSIS — R110 Nausea: Secondary | ICD-10-CM | POA: Insufficient documentation

## 2019-05-11 DIAGNOSIS — F17210 Nicotine dependence, cigarettes, uncomplicated: Secondary | ICD-10-CM | POA: Insufficient documentation

## 2019-05-11 DIAGNOSIS — T71162A Asphyxiation due to hanging, intentional self-harm, initial encounter: Secondary | ICD-10-CM | POA: Insufficient documentation

## 2019-05-11 DIAGNOSIS — F329 Major depressive disorder, single episode, unspecified: Secondary | ICD-10-CM | POA: Insufficient documentation

## 2019-05-11 DIAGNOSIS — R519 Headache, unspecified: Secondary | ICD-10-CM | POA: Insufficient documentation

## 2019-05-11 DIAGNOSIS — Z5320 Procedure and treatment not carried out because of patient's decision for unspecified reasons: Secondary | ICD-10-CM | POA: Insufficient documentation

## 2019-05-11 LAB — CBC WITH DIFFERENTIAL/PLATELET
Abs Immature Granulocytes: 0.07 10*3/uL (ref 0.00–0.07)
Basophils Absolute: 0 10*3/uL (ref 0.0–0.1)
Basophils Relative: 0 %
Eosinophils Absolute: 0.1 10*3/uL (ref 0.0–0.5)
Eosinophils Relative: 1 %
HCT: 43 % (ref 39.0–52.0)
Hemoglobin: 14.6 g/dL (ref 13.0–17.0)
Immature Granulocytes: 1 %
Lymphocytes Relative: 7 %
Lymphs Abs: 1.1 10*3/uL (ref 0.7–4.0)
MCH: 29.9 pg (ref 26.0–34.0)
MCHC: 34 g/dL (ref 30.0–36.0)
MCV: 88.1 fL (ref 80.0–100.0)
Monocytes Absolute: 0.6 10*3/uL (ref 0.1–1.0)
Monocytes Relative: 4 %
Neutro Abs: 13.2 10*3/uL — ABNORMAL HIGH (ref 1.7–7.7)
Neutrophils Relative %: 87 %
Platelets: 285 10*3/uL (ref 150–400)
RBC: 4.88 MIL/uL (ref 4.22–5.81)
RDW: 13.9 % (ref 11.5–15.5)
WBC: 15.1 10*3/uL — ABNORMAL HIGH (ref 4.0–10.5)
nRBC: 0 % (ref 0.0–0.2)

## 2019-05-11 LAB — RAPID URINE DRUG SCREEN, HOSP PERFORMED
Amphetamines: POSITIVE — AB
Barbiturates: NOT DETECTED
Benzodiazepines: NOT DETECTED
Cocaine: POSITIVE — AB
Opiates: NOT DETECTED
Tetrahydrocannabinol: POSITIVE — AB

## 2019-05-11 LAB — BASIC METABOLIC PANEL
Anion gap: 12 (ref 5–15)
BUN: 13 mg/dL (ref 6–20)
CO2: 21 mmol/L — ABNORMAL LOW (ref 22–32)
Calcium: 9.7 mg/dL (ref 8.9–10.3)
Chloride: 106 mmol/L (ref 98–111)
Creatinine, Ser: 1.43 mg/dL — ABNORMAL HIGH (ref 0.61–1.24)
GFR calc Af Amer: >60 mL/min (ref >60)
GFR calc non Af Amer: >60 mL/min (ref >60)
Glucose, Bld: 82 mg/dL (ref 70–99)
Potassium: 4.2 mmol/L (ref 3.5–5.1)
Sodium: 139 mmol/L (ref 135–145)

## 2019-05-11 LAB — ETHANOL: Alcohol, Ethyl (B): <10 mg/dL (ref <10)

## 2019-05-11 LAB — SALICYLATE LEVEL: Salicylate Lvl: <7 mg/dL (ref 2.8–30.0)

## 2019-05-11 LAB — ACETAMINOPHEN LEVEL: Acetaminophen (Tylenol), Serum: <10 ug/mL — ABNORMAL LOW (ref 10–30)

## 2019-05-11 MED ORDER — ACETAMINOPHEN 325 MG PO TABS: 650.0000 mg | ORAL_TABLET | ORAL | Status: DC | PRN

## 2019-05-11 MED ORDER — SODIUM CHLORIDE 0.9 % IV BOLUS: 1000.0000 mL | Freq: Once | INTRAVENOUS | Status: DC

## 2019-05-11 MED ORDER — ONDANSETRON HCL 4 MG/2ML IJ SOLN
4.0000 mg | Freq: Once | INTRAMUSCULAR | Status: DC
  Filled 2019-05-11: qty 2

## 2019-05-11 NOTE — [facility] Counselor (Signed)
Disposition: Lashunda Thomas, NP recommends in patient treatment. TTS to seek placement. 
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
## 2019-05-11 NOTE — ED Provider Notes (Addendum)
[facility] Provider Note   CSN: [id number] Arrival date & time: 05/11/19  0906     History   Chief Complaint Chief Complaint  Patient presents with   possible seizure    HPI Apolonio Macon LargeJ Cope is a 23 y.o. male w PMHx asthma, subarachnoid hemorrhage, presenting to the ED via EMS w possible seizure-like activity. Per EMS, pt's girlfriend reports finding patient on the floor next to the bed with vigorous shaking of his arms and drooling. There was no incontinence or tongue-biting. It lasted a short amount of time. EMS reports no evidence of post-ictal state upon arrival. Pt was answering questions appropriately with normal VS and CBG of 122. Was witnessed putting his finger in his throat to induce vomiting. Pt reports he was sleeping prior to event. He has had recent death in the family and was upset yesterday, though denies alcohol or drug use yesterday. He thinks he hit his head with pain to left eyebrow and left head. Some soreness to his left neck, though minor. He also feels cold. He is no longer nauseated. He denies any recent illness, no cough, no fever or abdominal complaints. No other symptoms reported. No personal or family hx of seizure.  Additional history obtained from patient's girlfriend.  See ED course.    The history is provided by the patient and the EMS personnel.    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 No pertinent family history.  Social  History Social History   Tobacco Use   Smoking status: Current Every Day Smoker    Packs/day: 0.50    Types: Cigarettes   Smokeless tobacco: Never Used  Substance Use Topics   Alcohol use: No    Comment: occasionally   Drug use: No    Types: Marijuana     Allergies   Penicillins   Review of Systems Review of Systems  Constitutional: Negative for fever.  Eyes: Negative for photophobia and visual disturbance.  Respiratory: Negative for cough and shortness of breath.   Cardiovascular: Negative for chest pain.  Gastrointestinal: Positive for nausea. Negative for abdominal pain and diarrhea.  Neurological: Positive for headaches.  Hematological: Does not bruise/bleed easily.  Psychiatric/Behavioral: Negative for confusion.  All other systems reviewed and are negative.    Physical Exam Updated Vital Signs BP 118/71    Pulse 82    Temp 97.9 F (36.6 C) (Oral)    Resp 14    SpO2 97%   Physical Exam Vitals signs and nursing note reviewed.  Constitutional:      General: He is not in acute distress.    Appearance: He is well-developed. He is not ill-appearing.  HENT:     Head: Normocephalic and atraumatic.  Eyes:     Conjunctiva/sclera: Conjunctivae normal.  Neck:     Comments: c-collar in place on evaluation, no TTP, pt able to range neck without pain.  Cardiovascular:  Rate and Rhythm: Normal rate and regular rhythm.  Pulmonary:     Effort: Pulmonary effort is normal. No respiratory distress.     Breath sounds: Normal breath sounds.  Abdominal:     General: Bowel sounds are normal.     Palpations: Abdomen is soft.     Tenderness: There is no abdominal tenderness. There is no guarding or rebound.  Skin:    General: Skin is warm.  Neurological:     General: No focal deficit present.     Mental Status: He is alert and oriented to person, place, and time.     Comments: Mental Status:  Alert, oriented, thought content appropriate, able to give a coherent  history. Speech fluent without evidence of aphasia. Able to follow 2 step commands without difficulty.  Cranial Nerves: pupils are moderately dilated to about 66mm, equal, round and reactive. EOM normal. CN intact Motor:  Normal tone. 5/5 in upper and lower extremities bilaterally including strong and equal grip strength and dorsiflexion/plantar flexion Sensory: grossly normal in all extremities.  Cerebellar: normal finger-to-nose with bilateral upper extremities Gait: deferred CV: distal pulses palpable throughout    Psychiatric:        Behavior: Behavior normal.      ED Treatments / Results  Labs (all labs ordered are listed, but only abnormal results are displayed) Labs Reviewed  BASIC METABOLIC PANEL - Abnormal; Notable for the following components:      Result Value   CO2 21 (*)    Creatinine, Ser 1.43 (*)    All other components within normal limits  RAPID URINE DRUG SCREEN, HOSP PERFORMED - Abnormal; Notable for the following components:   Cocaine POSITIVE (*)    Amphetamines POSITIVE (*)    Tetrahydrocannabinol POSITIVE (*)    All other components within normal limits  CBC WITH DIFFERENTIAL/PLATELET - Abnormal; Notable for the following components:   WBC 15.1 (*)    Neutro Abs 13.2 (*)    All other components within normal limits  ACETAMINOPHEN LEVEL - Abnormal; Notable for the following components:   Acetaminophen (Tylenol), Serum <10 (*)    All other components within normal limits  ETHANOL  SALICYLATE LEVEL    EKG None  Radiology Ct Head Wo Contrast  Result Date: 05/11/2019 CLINICAL DATA:  Questionable seizure like activity. Head trauma. Headache and weakness. EXAM: CT HEAD WITHOUT CONTRAST TECHNIQUE: Contiguous axial images were obtained from the base of the skull through the vertex without intravenous contrast. COMPARISON:  Head CT dated 12/05/2014 and report of head CT dated 01/12/2019 FINDINGS: Brain: No evidence of acute infarction, hemorrhage,  hydrocephalus, extra-axial collection or mass lesion/mass effect. Vascular: No hyperdense vessel or unexpected calcification. Skull: Normal. Negative for fracture or focal lesion. Sinuses/Orbits: Normal. Other: None IMPRESSION: Normal exam. Electronically Signed   By: Francene Boyers M.D.   On: 05/11/2019 11:18    Procedures Procedures (including critical care time)  Medications Ordered in ED Medications  ondansetron (ZOFRAN) injection 4 mg (4 mg Intravenous Refused 05/11/19 1051)  sodium chloride 0.9 % bolus 1,000 mL (has no administration in time range)  acetaminophen (TYLENOL) tablet 650 mg (has no administration in time range)     Initial Impression / Assessment and Plan / ED Course  I have reviewed the triage vital signs and the nursing notes.  Pertinent labs & imaging results that were available during my care of the patient were reviewed by me and considered in my medical decision making (see chart for details).  Clinical  Course as of May 10 1208  Mon May 11, 2019  1012 Patient's girlfriend has arrived to the ED and requesting to speak with provider in private.  She states she was not with the patient during the incident though his "baby mama" was the one with him in the hotel room and who called EMS.  The patient's girlfriend reports she was told that he was found by his "baby mama" with a telephone cord wrapped around his neck and attempted to commit suicide.  His girlfriend reports he has been depressed and not acting himself lately with multiple threats of suicide posted on Facebook.  She is concerned for his wellbeing.  She provided me with contact information for the person that was in the hotel room with him, however unable to contact her at this time.   [JR]  3220 URKYH with Patience, pt's "baby mama." Ms. Patience found patient hanging from black cord this morning when she entered the hotel room and was the one to call EMS.  About 1 hour prior to her arrival, she had spoken with  him and had no indication of his intentions. Immediately she cut him down, called the police and began pressing on his chest. She states once she cut him down he began shaking and drooling. He became conscious within 5-10 seconds. He was breathing on his own once she cut him down. Pt expressed he didn't want anyone to be told of the incident besides the girlfriend. Ms. Elberta Spaniel also reports he has been taking his aunt's passing very hard.    [JR]    Clinical Course User Index [JR] Anael Rosch, [place] N, PA-C      Patient presenting by EMS initially with concern for possible seizure-like activity though after further history obtained from patient's girlfriend and the person who found him today in the hotel room, it appears patiently had an intentional suicide attempt by hanging himself from a black telephone cord. He recently has experienced the death of his aunt and has not been dealing with it well per his family.  He also has been reportedly posting suicidal threats on social media.  Patient is denying any such activity with me though is not very convincing.  Both his girlfriend and friend who found him today have very similar story and are concerned about his wellbeing.  Initial work-up is reassuring with negative CT of the head.  He has no respiratory complaints or complaints of neck pain.  Labs with leukocytosis and positive UDS, otherwise unremarkable.  Low suspicion for seizure, abnormal movements likely 2/t to asphyxia. Patient placed under IVC and will need evaluation by psychiatry team.  He is medically clear.   Final Clinical Impressions(s) / ED Diagnoses   Final diagnoses:  Suicide attempt by hanging, initial encounter [facility])    ED Discharge Orders    None       Jaedyn Marrufo, [place] N, PA-C 05/11/19 1315    Bartt Gonzaga, [place] N, [place] 05/11/19 1513    Lajean Saver, MD 05/11/19 1529

## 2019-05-11 NOTE — ED Notes (Addendum)
This RN spoke with Jesse Dean at [facility] concerning that this pt has yet to be returned from when he ran away on foot. It was agreed that his IVC paperwork will be kept in the purple zone of the ED until they expire just incase he is found & returned before then.

## 2019-05-11 NOTE — ED Notes (Signed)
Officer called to serve IVC papers

## 2019-05-11 NOTE — BH Assessment (Addendum)
Tele Assessment Note   Patient Name: Jesse Dean MRN: [id number] Referring Physician: Ashok Cordia Location of Patient: [facility] ED Location of Provider: [place]  Jesse Dean is an 23 y.o. male presenting voluntarily to [facility] ED via EMS due to possible seizure activity after being found by his girlfriend with "vigorous shaking of his arms and drooling." EDP has since petitioned IVC. Patient reports he does not know what happened. He states that he had not had anything to eat or drink in over 24 hours and he woke up on the floor. He denies SI/HI/AVH. He denies substance use, however his UDS is positive for amphetamines, cocaine, and THC. He denies any prior mental health treatment or history of abuse. He reports an upcoming court date for a traffic violation, but no current criminal charges.   Additional history provided by patient's girlfriend to EDP: "She states she was not with the patient during the incident though his "baby mama" was the one with him in the hotel room and who called EMS.  The patient's girlfriend reports she was told that he was found by his "baby mama" with a telephone cord wrapped around his neck and attempted to commit suicide.  His girlfriend reports he has been depressed and not acting himself lately with multiple threats of suicide posted on Facebook.  She is concerned for his wellbeing."  Per the mother of patient's child: "found patient hanging from black cord this morning when she entered hotel room and called EMS....she immediately cut him down, called the police...he began shaking and drooling and became conscious within 5-10 seconds. Patiet expressed he didn't want anyone to be told of the incident besides his girlfriend."    Patient is alert and oriented x 4. He is dressed in scrubs, laying in hospital bed. His speech is logical, eye contact is fair, and thoughts are organized. His mood is depressed and his affect is congruent. Patient's insight,  judgement, and impulse control are impaired. He does not appear to be responding to internal stimuli or experiencing delusional thought content.  Diagnosis: F32.2 MDD, single episode, severe  Past Medical History:  Past Medical History:  Diagnosis Date  . Asthma   . Subarachnoid hemorrhage [facility])     Past Surgical History:  Procedure Laterality Date  . KNEE SURGERY    . PENIS REVASCULARIZATION SURGERY    . STOMACH SURGERY      Family History: History reviewed. No pertinent family history.  Social History:  reports that he has been smoking cigarettes. He has been smoking about 0.50 packs per day. He has never used smokeless tobacco. He reports that he does not drink alcohol or use drugs.  Additional Social History:  Alcohol / Drug Use Pain Medications: see MAR Prescriptions: see MAR Over the Counter: see MAR History of alcohol / drug use?: No history of alcohol / drug abuse  CIWA: CIWA-Ar BP: 118/71 Pulse Rate: 82 COWS:    Allergies:  Allergies  Allergen Reactions  . Penicillins Anaphylaxis    Has patient had a PCN reaction causing immediate rash, facial/tongue/throat swelling, SOB or lightheadedness with hypotension: Yes Has patient had a PCN reaction causing severe rash involving mucus membranes or skin necrosis: Yes Has patient had a PCN reaction that required hospitalization: No Has patient had a PCN reaction occurring within the last 10 years: Yes If all of the above answers are "NO", then may proceed with Cephalosporin use.     Home Medications: (Not in a hospital admission)  OB/GYN Status:  No LMP for male patient.  General Assessment Data Location of Assessment: [facility] ED TTS Assessment: In system Is this a Tele or Face-to-Face Assessment?: Tele Assessment Is this an Initial Assessment or a Re-assessment for this encounter?: Initial Assessment Patient Accompanied by:: Other Language Other than English: No Living Arrangements: Other (Comment)(hotel) What  gender do you identify as?: Male Marital status: Long term relationship Maiden name: Moyers Pregnancy Status: No Living Arrangements: Spouse/significant other Can pt return to current living arrangement?: Yes Admission Status: Involuntary Petitioner: ED Attending Is patient capable of signing voluntary admission?: No Referral Source: Self/Family/Friend Insurance type: none     Crisis Care Plan Living Arrangements: Spouse/significant other Legal Guardian: (self) Name of Psychiatrist: none Name of Therapist: none  Education Status Is patient currently in school?: No Is the patient employed, unemployed or receiving disability?: Unemployed  Risk to self with the past 6 months Suicidal Ideation: Yes-Currently Present Has patient been a risk to self within the past 6 months prior to admission? : Yes Suicidal Intent: Yes-Currently Present Has patient had any suicidal intent within the past 6 months prior to admission? : Yes Is patient at risk for suicide?: Yes Suicidal Plan?: Yes-Currently Present Has patient had any suicidal plan within the past 6 months prior to admission? : Yes Specify Current Suicidal Plan: hangingh Access to Means: Yes Specify Access to Suicidal Means: attempted with telephone cord What has been your use of drugs/alcohol within the last 12 months?: cocaine, amphetamines, THC Previous Attempts/Gestures: No How many times?: 0 Other Self Harm Risks: none noted Triggers for Past Attempts: None known Intentional Self Injurious Behavior: None Family Suicide History: No Recent stressful life event(s): Conflict (Comment), Financial Problems Persecutory voices/beliefs?: No Depression: Yes Depression Symptoms: Despondent, Insomnia, Tearfulness, Isolating, Fatigue, Guilt, Loss of interest in usual pleasures, Feeling worthless/self pity, Feeling angry/irritable Substance abuse history and/or treatment for substance abuse?: No Suicide prevention information given to  non-admitted patients: Not applicable  Risk to Others within the past 6 months Homicidal Ideation: No Does patient have any lifetime risk of violence toward others beyond the six months prior to admission? : No Thoughts of Harm to Others: No Current Homicidal Intent: No Current Homicidal Plan: No Access to Homicidal Means: No Identified Victim: none History of harm to others?: No Assessment of Violence: None Noted Violent Behavior Description: none noted Does patient have access to weapons?: No Criminal Charges Pending?: No Does patient have a court date: No Is patient on probation?: No  Psychosis Hallucinations: None noted Delusions: None noted  Mental Status Report Appearance/Hygiene: In scrubs Eye Contact: Good Motor Activity: Freedom of movement Speech: Logical/coherent Level of Consciousness: Alert Mood: Depressed Affect: Depressed Anxiety Level: Minimal Thought Processes: Coherent, Relevant Judgement: Impaired Orientation: Person, Place, Time, Situation Obsessive Compulsive Thoughts/Behaviors: None  Cognitive Functioning Concentration: Normal Memory: Remote Intact, Recent Impaired Is patient IDD: No Insight: Poor Impulse Control: Poor Appetite: Poor Have you had any weight changes? : No Change Sleep: Decreased Total Hours of Sleep: 0 Vegetative Symptoms: None  ADLScreening [facility] Assessment Services) Patient's cognitive ability adequate to safely complete daily activities?: Yes Patient able to express need for assistance with ADLs?: Yes Independently performs ADLs?: Yes (appropriate for developmental age)  Prior Inpatient Therapy Prior Inpatient Therapy: No  Prior Outpatient Therapy Prior Outpatient Therapy: No Does patient have an ACCT team?: No Does patient have Intensive In-House Services?  : No Does patient have [facility] services? : No Does patient have P4CC services?: No  ADL Screening (  condition at time of admission) Patient's cognitive ability  adequate to safely complete daily activities?: Yes Is the patient deaf or have difficulty hearing?: No Does the patient have difficulty seeing, even when wearing glasses/contacts?: No Does the patient have difficulty concentrating, remembering, or making decisions?: No Patient able to express need for assistance with ADLs?: Yes Does the patient have difficulty dressing or bathing?: No Independently performs ADLs?: Yes (appropriate for developmental age) Does the patient have difficulty walking or climbing stairs?: No Weakness of Legs: None Weakness of Arms/Hands: None  Home Assistive Devices/Equipment Home Assistive Devices/Equipment: None  Therapy Consults (therapy consults require a physician order) PT Evaluation Needed: No OT Evalulation Needed: No SLP Evaluation Needed: No Abuse/Neglect Assessment (Assessment to be complete while patient is alone) Abuse/Neglect Assessment Can Be Completed: Yes Physical Abuse: Denies Verbal Abuse: Denies Sexual Abuse: Denies Exploitation of patient/patient's resources: Denies Self-Neglect: Denies Values / Beliefs Cultural Requests During Hospitalization: None Spiritual Requests During Hospitalization: None Consults Spiritual Care Consult Needed: No Social Work Consult Needed: No Merchant navy officerAdvance Directives (For Healthcare) Does Patient Have a Medical Advance Directive?: No Would patient like information on creating a medical advance directive?: No - Patient declined          Disposition: Denzil MagnusonLashunda Thomas, NP recommends in patient treatment. TTS to seek placement. Disposition Initial Assessment Completed for this Encounter: Yes  This service was provided via telemedicine using a 2-way, interactive audio and video technology.  Names of all persons participating in this telemedicine service and their role in this encounter. Name: Jesse Dean Role: patient  Name: Celedonio MiyamotoMeredith Tressa Maldonado, LCSW Role: TTS  Name:  Role:   Name:  Role:    Celedonio MiyamotoMeredith   Saketh Daubert 05/11/2019 1:25 PM

## 2019-05-11 NOTE — ED Notes (Signed)
When pt was being moved into a hall bed he was very agitated & irate that he was been "kept" (IVC), security & off duty [employer] was at the room to aid in moving pt if he remained uncooperative. Pt was talked down by some staff members & has became more calm & accepting to abide by the treatment plan. He did change into his purple scrubs & was given a ginger ale while he sat calmly.

## 2019-05-11 NOTE — ED Notes (Signed)
REGULAR DINNER TRAY ORDERED

## 2019-05-11 NOTE — Care Management (Addendum)
  Patient accepted to: [name] Accepting: Dr. Jonelle Sports The number to give report [phone] The date and time that the patient can be transported on 05-12-2019 after 9am

## 2019-05-11 NOTE — ED Notes (Signed)
Pt is on with TTS

## 2019-05-11 NOTE — ED Triage Notes (Signed)
Pt was found on the floor in his hotel room by his girl friend & reported that he was drooling & shaking. EMS reports upon their arrival that he was A&Ox4 & only reports a HA & thirst. C-collar was applied, pt reports that he thinks he hit his head, upon arrival to ED pt remains AOx4 & VSS.

## 2019-05-11 NOTE — BH Assessment (Signed)
Clinician spoke w/ pt's nurse, Arby Barrette RN, regarding pt running from the hospital AMA. Agreed pt's IVC paperwork should remain at the hospital in the purple zone under the circumstances that he is found and is returned to the hospital.

## 2019-08-09 ENCOUNTER — Telehealth: Payer: Medicaid Other | Admitting: Physician Assistant

## 2019-08-09 DIAGNOSIS — R0602 Shortness of breath: Secondary | ICD-10-CM

## 2019-08-09 DIAGNOSIS — Z20822 Contact with and (suspected) exposure to covid-19: Secondary | ICD-10-CM

## 2019-08-09 NOTE — Progress Notes (Signed)
  E-Visit for [employer] Virus Screening  Based on what you have shared with me, you need to seek an evaluation for a severe illness that is causing your symptoms which may be coronavirus or some other illness. I recommend that you be seen and evaluated "face to face" giving severity of shortness of breath. You need assessment of oxygen status and a lung examination/potential chest x-ray to see if there is a potential secondary bacterial pneumonia from COVID.  If you are considered high risk for Corona virus because of a known exposure, fever, shortness of breath and cough, OR if you have severe symptoms of any kind, seek medical care at an emergency room. Our Emergency Departments are best equipped to handle patients with severe symptoms.  You will be evaluated by the ER provider (or higher level of care provider) who will determine whether you need formal testing.  If you are having a true medical emergency please call 911.   I recommend the following:  . [facility] Emergency Department [address] [place], [place], [place] [address] [phone]  . [facility] Emergency Department [address] [place], [place] [address] [phone]  . [facility] Emergency Department [address] [place], [place], [place] [address] [phone]  . [facility] Emergency Department [address] [place], [place], [place] [address] [phone]  . [facility] Emergency Department [address] [place], [place], [place] [address] [phone]  NOTE: If you entered your credit card information for this eVisit, you will not be charged. You may see a "hold" on your card for the $35 but that hold will drop off and you will not have a charge processed.   Your e-visit answers were reviewed by a board certified advanced clinical practitioner to complete your personal care plan.  Thank you for using e-Visits.

## 2019-10-28 ENCOUNTER — Emergency Department (HOSPITAL_COMMUNITY): Payer: No Typology Code available for payment source

## 2019-10-28 ENCOUNTER — Other Ambulatory Visit: Payer: Self-pay

## 2019-10-28 ENCOUNTER — Emergency Department (HOSPITAL_COMMUNITY)
Admission: EM | Admit: 2019-10-28 | Discharge: 2019-10-28 | Disposition: A | Discharge status: Home / Self Care | Payer: No Typology Code available for payment source | Attending: Emergency Medicine | Admitting: Emergency Medicine

## 2019-10-28 ENCOUNTER — Encounter (HOSPITAL_COMMUNITY): Payer: Self-pay

## 2019-10-28 DIAGNOSIS — Y998 Other external cause status: Secondary | ICD-10-CM | POA: Insufficient documentation

## 2019-10-28 DIAGNOSIS — M25512 Pain in left shoulder: Secondary | ICD-10-CM | POA: Diagnosis not present

## 2019-10-28 DIAGNOSIS — Y9389 Activity, other specified: Secondary | ICD-10-CM | POA: Insufficient documentation

## 2019-10-28 DIAGNOSIS — G44319 Acute post-traumatic headache, not intractable: Secondary | ICD-10-CM | POA: Diagnosis not present

## 2019-10-28 DIAGNOSIS — M542 Cervicalgia: Secondary | ICD-10-CM | POA: Diagnosis not present

## 2019-10-28 DIAGNOSIS — M7918 Myalgia, other site: Secondary | ICD-10-CM

## 2019-10-28 DIAGNOSIS — Y92410 Unspecified street and highway as the place of occurrence of the external cause: Secondary | ICD-10-CM | POA: Insufficient documentation

## 2019-10-28 DIAGNOSIS — S79912A Unspecified injury of left hip, initial encounter: Secondary | ICD-10-CM | POA: Diagnosis present

## 2019-10-28 DIAGNOSIS — J45909 Unspecified asthma, uncomplicated: Secondary | ICD-10-CM | POA: Diagnosis not present

## 2019-10-28 DIAGNOSIS — S7002XA Contusion of left hip, initial encounter: Secondary | ICD-10-CM | POA: Diagnosis not present

## 2019-10-28 DIAGNOSIS — R109 Unspecified abdominal pain: Secondary | ICD-10-CM | POA: Diagnosis not present

## 2019-10-28 DIAGNOSIS — M549 Dorsalgia, unspecified: Secondary | ICD-10-CM | POA: Diagnosis not present

## 2019-10-28 DIAGNOSIS — F17210 Nicotine dependence, cigarettes, uncomplicated: Secondary | ICD-10-CM | POA: Insufficient documentation

## 2019-10-28 DIAGNOSIS — R0789 Other chest pain: Secondary | ICD-10-CM | POA: Insufficient documentation

## 2019-10-28 DIAGNOSIS — W2210XA Striking against or struck by unspecified automobile airbag, initial encounter: Secondary | ICD-10-CM | POA: Diagnosis not present

## 2019-10-28 LAB — COMPREHENSIVE METABOLIC PANEL
ALT: 13 U/L (ref 0–44)
AST: 22 U/L (ref 15–41)
Albumin: 4 g/dL (ref 3.5–5.0)
Alkaline Phosphatase: 72 U/L (ref 38–126)
Anion gap: 10 (ref 5–15)
BUN: 13 mg/dL (ref 6–20)
CO2: 23 mmol/L (ref 22–32)
Calcium: 9.5 mg/dL (ref 8.9–10.3)
Chloride: 106 mmol/L (ref 98–111)
Creatinine, Ser: 1.17 mg/dL (ref 0.61–1.24)
GFR calc Af Amer: >60 mL/min (ref >60)
GFR calc non Af Amer: >60 mL/min (ref >60)
Glucose, Bld: 85 mg/dL (ref 70–99)
Potassium: 4.4 mmol/L (ref 3.5–5.1)
Sodium: 139 mmol/L (ref 135–145)
Total Bilirubin: 0.6 mg/dL (ref 0.3–1.2)
Total Protein: 7 g/dL (ref 6.5–8.1)

## 2019-10-28 LAB — URINALYSIS, ROUTINE W REFLEX MICROSCOPIC
Bilirubin Urine: NEGATIVE
Glucose, UA: NEGATIVE mg/dL
Hgb urine dipstick: NEGATIVE
Ketones, ur: 5 mg/dL — AB
Leukocytes,Ua: NEGATIVE
Nitrite: NEGATIVE
Protein, ur: NEGATIVE mg/dL
Specific Gravity, Urine: 1.043 — ABNORMAL HIGH (ref 1.005–1.030)
pH: 7 (ref 5.0–8.0)

## 2019-10-28 LAB — LACTIC ACID, PLASMA: Lactic Acid, Venous: 1.4 mmol/L (ref 0.5–1.9)

## 2019-10-28 LAB — I-STAT CHEM 8, ED
BUN: 16 mg/dL (ref 6–20)
Calcium, Ion: 0.87 mmol/L — CL (ref 1.15–1.40)
Chloride: 110 mmol/L (ref 98–111)
Creatinine, Ser: 1.2 mg/dL (ref 0.61–1.24)
Glucose, Bld: 74 mg/dL (ref 70–99)
HCT: 46 % (ref 39.0–52.0)
Hemoglobin: 15.6 g/dL (ref 13.0–17.0)
Potassium: 4.2 mmol/L (ref 3.5–5.1)
Sodium: 136 mmol/L (ref 135–145)
TCO2: 23 mmol/L (ref 22–32)

## 2019-10-28 LAB — CBC
HCT: 44.4 % (ref 39.0–52.0)
Hemoglobin: 14.6 g/dL (ref 13.0–17.0)
MCH: 28.6 pg (ref 26.0–34.0)
MCHC: 32.9 g/dL (ref 30.0–36.0)
MCV: 87.1 fL (ref 80.0–100.0)
Platelets: 344 10*3/uL (ref 150–400)
RBC: 5.1 MIL/uL (ref 4.22–5.81)
RDW: 13.6 % (ref 11.5–15.5)
WBC: 7.1 10*3/uL (ref 4.0–10.5)
nRBC: 0 % (ref 0.0–0.2)

## 2019-10-28 LAB — PROTIME-INR
INR: 1 (ref 0.8–1.2)
Prothrombin Time: 13.1 seconds (ref 11.4–15.2)

## 2019-10-28 LAB — ETHANOL: Alcohol, Ethyl (B): <10 mg/dL (ref <10)

## 2019-10-28 LAB — SAMPLE TO BLOOD BANK

## 2019-10-28 MED ORDER — SODIUM CHLORIDE 0.9 % IV BOLUS
1000.0000 mL | Freq: Once | INTRAVENOUS | Status: AC
  Administered 2019-10-28: 1000 mL via INTRAVENOUS

## 2019-10-28 MED ORDER — LORAZEPAM 2 MG/ML IJ SOLN
1.0000 mg | Freq: Once | INTRAMUSCULAR | Status: AC
  Administered 2019-10-28: 1 mg via INTRAVENOUS
  Filled 2019-10-28: qty 1

## 2019-10-28 MED ORDER — ALBUTEROL SULFATE HFA 108 (90 BASE) MCG/ACT IN AERS
2.0000 | INHALATION_SPRAY | Freq: Once | RESPIRATORY_TRACT | Status: AC
  Administered 2019-10-28: 2 via RESPIRATORY_TRACT
  Filled 2019-10-28: qty 6.7

## 2019-10-28 MED ORDER — HYDROMORPHONE HCL 1 MG/ML IJ SOLN
0.5000 mg | Freq: Once | INTRAMUSCULAR | Status: AC
  Administered 2019-10-28: 0.5 mg via INTRAVENOUS
  Filled 2019-10-28: qty 1

## 2019-10-28 MED ORDER — FENTANYL CITRATE (PF) 100 MCG/2ML IJ SOLN
50.0000 ug | Freq: Once | INTRAMUSCULAR | Status: AC
  Administered 2019-10-28: 50 ug via INTRAVENOUS
  Filled 2019-10-28: qty 2

## 2019-10-28 MED ORDER — NAPROXEN 500 MG PO TABS: 500.0000 mg | ORAL_TABLET | Freq: Two times a day (BID) | ORAL | 0 refills | Status: DC

## 2019-10-28 MED ORDER — IOHEXOL 300 MG/ML  SOLN
80.0000 mL | Freq: Once | INTRAMUSCULAR | Status: AC | PRN
  Administered 2019-10-28: 80 mL via INTRAVENOUS

## 2019-10-28 MED ORDER — METHOCARBAMOL 500 MG PO TABS: 1000.0000 mg | ORAL_TABLET | Freq: Three times a day (TID) | ORAL | 0 refills | Status: DC | PRN

## 2019-10-28 NOTE — ED Notes (Signed)
Ambulated pt. Down hallway, had some right leg pain, but walked under own power and had no dizziness, is a&o x4 and passed fluid challenge.
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
## 2019-10-28 NOTE — ED Notes (Signed)
Josh G. PA at bedside.
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
## 2019-10-28 NOTE — ED Notes (Signed)
Pt transported to CT with this RN and Rudene Christians. NT
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
## 2020-03-07 ENCOUNTER — Emergency Department (HOSPITAL_COMMUNITY)
Admission: EM | Admit: 2020-03-07 | Discharge: 2020-03-08 | Discharge status: Against Medical Advice | Payer: Medicaid Other

## 2020-03-07 ENCOUNTER — Other Ambulatory Visit: Payer: Self-pay

## 2020-03-07 NOTE — ED Notes (Signed)
Pt called for triage, no response. 

## 2020-03-07 NOTE — ED Notes (Signed)
Pt called for VS prior to triage, no response.  

## 2021-11-02 ENCOUNTER — Encounter (HOSPITAL_COMMUNITY): Payer: Self-pay

## 2021-11-02 ENCOUNTER — Ambulatory Visit (HOSPITAL_COMMUNITY)
Admission: RE | Admit: 2021-11-02 | Discharge: 2021-11-02 | Disposition: A | Discharge status: Home / Self Care | Payer: Medicaid Other | Source: Ambulatory Visit

## 2021-11-02 VITALS — BP 122/80 | HR 82 | Temp 97.9°F | Resp 17

## 2021-11-02 DIAGNOSIS — M545 Low back pain, unspecified: Secondary | ICD-10-CM

## 2021-11-02 NOTE — Discharge Instructions (Signed)
I am glad that you are feeling better.  Use heat and gentle stretch for symptom relief.  You can alternate Tylenol and ibuprofen over-the-counter.  If you develop any severe symptoms including significant pain, weakness, numbness, tingling sensation in your extremities, severe headache, nausea, vomiting you need to be seen immediately. ?
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
## 2022-03-22 ENCOUNTER — Emergency Department (HOSPITAL_COMMUNITY)
Admission: EM | Admit: 2022-03-22 | Discharge: 2022-03-22 | Disposition: A | Discharge status: Home / Self Care | Payer: Self-pay | Attending: Emergency Medicine | Admitting: Emergency Medicine

## 2022-03-22 ENCOUNTER — Encounter (HOSPITAL_COMMUNITY): Payer: Self-pay | Admitting: Emergency Medicine

## 2022-03-22 ENCOUNTER — Other Ambulatory Visit: Payer: Self-pay

## 2022-03-22 DIAGNOSIS — Z5321 Procedure and treatment not carried out due to patient leaving prior to being seen by health care provider: Secondary | ICD-10-CM | POA: Insufficient documentation

## 2022-03-22 DIAGNOSIS — L02412 Cutaneous abscess of left axilla: Secondary | ICD-10-CM | POA: Insufficient documentation

## 2022-03-22 NOTE — ED Notes (Signed)
Called pt to bring back to triage and unable to find in lobby

## 2022-03-22 NOTE — ED Triage Notes (Signed)
Patient here with reporting of an abscess to the L armpit area. Patient reports this was drained a week ago but it has returned.

## 2022-04-02 ENCOUNTER — Encounter (HOSPITAL_COMMUNITY): Payer: Self-pay

## 2022-04-02 ENCOUNTER — Ambulatory Visit (INDEPENDENT_AMBULATORY_CARE_PROVIDER_SITE_OTHER): Payer: Self-pay

## 2022-04-02 ENCOUNTER — Ambulatory Visit (HOSPITAL_COMMUNITY)
Admission: EM | Admit: 2022-04-02 | Discharge: 2022-04-02 | Disposition: A | Discharge status: Home / Self Care | Payer: Self-pay | Attending: Physician Assistant | Admitting: Physician Assistant

## 2022-04-02 DIAGNOSIS — M79644 Pain in right finger(s): Secondary | ICD-10-CM

## 2022-04-02 DIAGNOSIS — S60051A Contusion of right little finger without damage to nail, initial encounter: Secondary | ICD-10-CM

## 2022-04-02 NOTE — ED Provider Notes (Signed)
[facility]    CSN: [id number] Arrival date & time: 04/02/22  1319      History   Chief Complaint Chief Complaint  Patient presents with   Hand Pain    HPI Jesse Dean is a 26 y.o. male.   26 year old male presents with right fifth digit pain.  Patient indicates several days ago he jammed his right pinky finger opening the door warmer at work.  Patient indicates that he has had mild swelling since with intermittent pain.  He indicates that his employer will not allow him to return to work until he has the finger evaluated.  Patient relates that his function and use of the right hand fifth digit is normal, he just has swelling at the injury site.   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 Authorizing Provider  albuterol (VENTOLIN HFA) 108 (90 Base) MCG/ACT inhaler Inhale 1 puff into the lungs every 6 (six) hours as needed for wheezing or shortness of breath.    [provider]  methocarbamol (ROBAXIN) 500 MG tablet Take 2 tablets (1,000 mg total) by mouth every 8 (eight) hours as needed for muscle spasms. 10/28/19   Renne Crigler, PA-C  naproxen (NAPROSYN) 500 MG tablet Take 1 tablet (500 mg total) by mouth 2 (two) times daily. 10/28/19   Renne Crigler, PA-C    Family History Family History  Problem Relation Age of Onset   Healthy Mother    Healthy Father     Social History Social History   Tobacco Use   Smoking status: Every Day    Packs/day: 0.50    Types: Cigarettes    Smokeless tobacco: Never  Substance Use Topics   Alcohol use: No    Comment: occasionally   Drug use: No    Types: Marijuana     Allergies   Penicillins   Review of Systems Review of Systems  Musculoskeletal:  Positive for joint swelling (right hand 5th digit at the PIP joint area).     Physical Exam Triage Vital Signs ED Triage Vitals  Enc Vitals Group     BP 04/02/22 1412 115/75     Pulse Rate 04/02/22 1412 68     Resp 04/02/22 1412 16     Temp 04/02/22 1412 97.8 F (36.6 C)     Temp src --      SpO2 04/02/22 1412 98 %     Weight --      Height --      Head Circumference --      Peak Flow --      Pain Score 04/02/22 1411 0     Pain Loc --      Pain Edu? --      Excl. in GC? --    No data found.  Updated Vital Signs BP 115/75   Pulse 68   Temp 97.8 F (36.6 C)   Resp 16   SpO2  98%   Visual Acuity Right Eye Distance:   Left Eye Distance:   Bilateral Distance:    Right Eye Near:   Left Eye Near:    Bilateral Near:     Physical Exam Constitutional:      Appearance: Normal appearance.  Musculoskeletal:     Comments: Right hand: Fifth digit has 1+ swelling at the PIP joint area.  There is no redness or tenderness on palpation.  F ROM is normal, stability is normal.  Neurological:     Mental Status: He is alert.      UC Treatments / Results  Labs (all labs ordered are listed, but only abnormal results are displayed) Labs Reviewed - No data to display  EKG   Radiology DG Finger Little Right  Result Date: 04/02/2022 CLINICAL DATA:  Jammed finger at PIP joint at work 7 days ago. Mild swelling. EXAM: RIGHT LITTLE FINGER 2+V COMPARISON:  Right wrist and hand radiographs 02/18/2015 FINDINGS: Interval healing of the prior remote fracture at the distal medial aspect of the fifth metacarpal head. Mild degenerative spurring of the dorsal greater than volar aspects of the base of the distal phalanx of the fifth finger. No acute fracture is seen. No  dislocation. IMPRESSION: 1. No acute fracture is seen. 2. Minimal degenerative changes of the fifth finger DIP joint. Electronically Signed   By: Neita Garnet M.D.   On: 04/02/2022 14:48    Procedures Procedures (including critical care time)  Medications Ordered in UC Medications - No data to display  Initial Impression / Assessment and Plan / UC Course  I have reviewed the triage vital signs and the nursing notes.  Pertinent labs & imaging results that were available during my care of the patient were reviewed by me and considered in my medical decision making (see chart for details).    Plan: 1.  Advised take ibuprofen 600 mg every 8 hours with food to help reduce the swelling over the next several days. 2.  Advised to return to urgent care as needed. Final Clinical Impressions(s) / UC Diagnoses   Final diagnoses:  Finger pain, right  Contusion of right little finger without damage to nail, initial encounter     Discharge Instructions      Advised to be careful with pinky finger, take ibuprofen 600 mg every 8 hours with food to help reduce the swelling. Advised it will take several weeks for the swelling and finger to return to normal. Return to urgent care as needed.    ED Prescriptions   None    PDMP not reviewed this encounter.   Ellsworth Lennox, PA-C 04/02/22 1500

## 2022-04-02 NOTE — ED Triage Notes (Signed)
Pt presents with swollen right ring finger. Reports it started when he jammed it at work x 7 days ago. Denies pain but swelling has continued.

## 2022-04-02 NOTE — Discharge Instructions (Addendum)
Advised to be careful with pinky finger, take ibuprofen 600 mg every 8 hours with food to help reduce the swelling. Advised it will take several weeks for the swelling and finger to return to normal. Return to urgent care as needed.

## 2022-08-15 ENCOUNTER — Ambulatory Visit (HOSPITAL_COMMUNITY)
Admission: EM | Admit: 2022-08-15 | Discharge: 2022-08-15 | Disposition: A | Discharge status: Home / Self Care | Payer: Medicaid Other | Attending: Family Medicine | Admitting: Family Medicine

## 2022-08-15 ENCOUNTER — Encounter (HOSPITAL_COMMUNITY): Payer: Self-pay

## 2022-08-15 DIAGNOSIS — Z113 Encounter for screening for infections with a predominantly sexual mode of transmission: Secondary | ICD-10-CM | POA: Diagnosis not present

## 2022-08-15 DIAGNOSIS — Z202 Contact with and (suspected) exposure to infections with a predominantly sexual mode of transmission: Secondary | ICD-10-CM | POA: Diagnosis not present

## 2022-08-15 MED ORDER — DOXYCYCLINE HYCLATE 100 MG PO CAPS: 100.0000 mg | ORAL_CAPSULE | Freq: Two times a day (BID) | ORAL | 0 refills | Status: AC

## 2022-08-15 NOTE — ED Provider Notes (Signed)
[place]    CSN: [id number] Arrival date & time: 08/15/22  1119      History   Chief Complaint Chief Complaint  Patient presents with   SEXUALLY TRANSMITTED DISEASE    HPI Doris DRAGO HAMMONDS is a 27 y.o. male.   HPI Here for positive RPR test at the plasma center where he has been donating plasma.  I see RPR test that are negative in epic in 2017 and 2018.  He states he has never had a positive RPR before, and he has been donating plasma regularly and so has been having regular testing.  This is the first time it has been positive.  He brings in papers showing the result that is still pending.  That is there is no titer and no FTA result available at this time.  He states he did have a "cut" on his penis that was painful for a couple of months, but it is now resolved and healed over.  No dysuria and no penile discharge.  No itching.  He has been screened for HIV to donate the plasma also.  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 Authorizing Provider  doxycycline (VIBRAMYCIN) 100 MG capsule Take 1 capsule (100 mg total) by mouth 2 (two) times daily for 14 days. 08/15/22 08/29/22 Yes Tracye Szuch, Gwenlyn Perking, MD  albuterol (VENTOLIN HFA) 108 (90 Base) MCG/ACT inhaler Inhale 1 puff into the lungs every 6 (six) hours as needed for wheezing or shortness of breath.    [provider]    Family History Family History  Problem Relation Age of Onset   Healthy Mother    Healthy Father     Social  History Social History   Tobacco Use   Smoking status: Every Day    Packs/day: 0.50    Types: Cigarettes   Smokeless tobacco: Never  Substance Use Topics   Alcohol use: No    Comment: occasionally   Drug use: No    Types: Marijuana     Allergies   Penicillins   Review of Systems Review of Systems   Physical Exam Triage Vital Signs ED Triage Vitals [08/15/22 1212]  Enc Vitals Group     BP 114/76     Pulse Rate 76     Resp 16     Temp 98.2 F (36.8 C)     Temp Source Oral     SpO2 98 %     Weight      Height      Head Circumference      Peak Flow      Pain Score 0     Pain Loc      Pain Edu?      Excl. in [place]?    No data found.  Updated Vital Signs BP 114/76 (BP Location: Left Arm)   Pulse 76   Temp 98.2 F (36.8 C) (Oral)   Resp 16   SpO2 98%  Visual Acuity Right Eye Distance:   Left Eye Distance:   Bilateral Distance:    Right Eye Near:   Left Eye Near:    Bilateral Near:     Physical Exam Vitals reviewed.  Constitutional:      General: He is not in acute distress.    Appearance: He is not ill-appearing, toxic-appearing or diaphoretic.  HENT:     Mouth/Throat:     Mouth: Mucous membranes are moist.  Eyes:     Extraocular Movements: Extraocular movements intact.     Pupils: Pupils are equal, round, and reactive to light.  Cardiovascular:     Rate and Rhythm: Normal rate and regular rhythm.     Heart sounds: No murmur heard. Pulmonary:     Effort: Pulmonary effort is normal.     Breath sounds: No stridor. No wheezing, rhonchi or rales.  Skin:    Capillary Refill: Capillary refill takes less than 2 seconds.     Coloration: Skin is not jaundiced or pale.     Findings: No rash.  Neurological:     General: No focal deficit present.     Mental Status: He is alert and oriented to person, place, and time.  Psychiatric:        Behavior: Behavior normal.      UC Treatments / Results  Labs (all labs ordered are listed, but only  abnormal results are displayed) Labs Reviewed  RPR  CYTOLOGY, (ORAL, ANAL, URETHRAL) ANCILLARY ONLY    EKG   Radiology No results found.  Procedures Procedures (including critical care time)  Medications Ordered in UC Medications - No data to display  Initial Impression / Assessment and Plan / UC Course  I have reviewed the triage vital signs and the nursing notes.  Pertinent labs & imaging results that were available during my care of the patient were reviewed by me and considered in my medical decision making (see chart for details).        RPR and FTA are repeated today.  He has anaphylactic reaction to penicillin.  Doxycycline is sent in to treat, but I have asked him to wait to start with confirmation of positive results with our blood testing.  Also screening for STDs is done with a cytology swab, and staff will call him if anything is positive and treat per protocol. Final Clinical Impressions(s) / UC Diagnoses   Final diagnoses:  Screening for STDs (sexually transmitted diseases)     Discharge Instructions      Staff will notify you of any positives on the swab, and also if you need treatment for the blood test.  I would prefer you to wait to start the doxycycline when/if you get the call about the syphilis test being positive for sure.     ED Prescriptions     Medication Sig Dispense Auth. Provider   doxycycline (VIBRAMYCIN) 100 MG capsule Take 1 capsule (100 mg total) by mouth 2 (two) times daily for 14 days. 28 capsule Windy Carina Gwenlyn Perking, MD      PDMP not reviewed this encounter.   Barrett Henle, MD 08/15/22 1257

## 2022-08-15 NOTE — ED Triage Notes (Signed)
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

## 2022-08-15 NOTE — Discharge Instructions (Signed)
Staff will notify you of any positives on the swab, and also if you need treatment for the blood test.  I would prefer you to wait to start the doxycycline when/if you get the call about the syphilis test being positive for sure.

## 2022-08-16 LAB — CYTOLOGY, (ORAL, ANAL, URETHRAL) ANCILLARY ONLY
Chlamydia: NEGATIVE
Comment: NEGATIVE
Comment: NEGATIVE
Comment: NORMAL
Neisseria Gonorrhea: NEGATIVE
Trichomonas: NEGATIVE

## 2022-08-16 LAB — RPR
RPR Ser Ql: REACTIVE — AB
RPR Titer: 1:64 {titer}

## 2022-08-17 LAB — T.PALLIDUM AB, TOTAL: T Pallidum Abs: REACTIVE — AB

## 2022-09-05 ENCOUNTER — Telehealth: Payer: Self-pay

## 2022-09-05 NOTE — Telephone Encounter (Signed)
Sending mychart msg. AS, CMA 

## 2022-11-25 ENCOUNTER — Other Ambulatory Visit: Payer: Self-pay

## 2022-11-25 ENCOUNTER — Encounter (HOSPITAL_COMMUNITY): Payer: Self-pay | Admitting: Emergency Medicine

## 2022-11-25 ENCOUNTER — Ambulatory Visit (HOSPITAL_COMMUNITY)
Admission: EM | Admit: 2022-11-25 | Discharge: 2022-11-25 | Disposition: A | Discharge status: Home / Self Care | Payer: Commercial Managed Care - HMO | Attending: Physician Assistant | Admitting: Physician Assistant

## 2022-11-25 DIAGNOSIS — R1013 Epigastric pain: Secondary | ICD-10-CM | POA: Diagnosis present

## 2022-11-25 DIAGNOSIS — N342 Other urethritis: Secondary | ICD-10-CM | POA: Diagnosis present

## 2022-11-25 DIAGNOSIS — Z113 Encounter for screening for infections with a predominantly sexual mode of transmission: Secondary | ICD-10-CM | POA: Diagnosis present

## 2022-11-25 LAB — HIV ANTIBODY (ROUTINE TESTING W REFLEX): HIV Screen 4th Generation wRfx: NONREACTIVE

## 2022-11-25 MED ORDER — ALBUTEROL SULFATE HFA 108 (90 BASE) MCG/ACT IN AERS: 1.0000 | INHALATION_SPRAY | Freq: Four times a day (QID) | RESPIRATORY_TRACT | 1 refills | Status: DC | PRN

## 2022-11-25 MED ORDER — FAMOTIDINE 20 MG PO TABS: 20.0000 mg | ORAL_TABLET | Freq: Two times a day (BID) | ORAL | 0 refills | Status: AC

## 2022-11-25 MED ORDER — SPACER/AERO-HOLDING CHAMBERS DEVI: 1.0000 [IU] | Freq: Three times a day (TID) | 0 refills | Status: AC

## 2022-11-25 NOTE — ED Triage Notes (Signed)
Patient says he was seen 3 months ago and was "treated for syphilis with pills".  Says he is getting sick again, hands and feet are pealing and swollen knot left groin area.

## 2022-11-25 NOTE — Discharge Instructions (Signed)
Lab results will be completed in 48 hours.  If you do not get a call from this office that indicates the test are negative.  Log onto MyChart to be the test results when they post in 48 hours.  Advised take Pepcid 20 mg twice daily as this will help improve the indigestion and reflux.  Advised follow-up PCP return to urgent care as needed.

## 2022-11-25 NOTE — ED Provider Notes (Addendum)
[facility]    CSN: [id number] Arrival date & time: 11/25/22  1324      History   Chief Complaint Chief Complaint  Patient presents with   SEXUALLY TRANSMITTED DISEASE    HPI Jesse Dean is a 27 y.o. male.   27 year old male presents for STI testing.  Patient indicates that he tested positive for syphilis 08/15/2022.  Patient indicates that he did take all of the doxycycline.  He indicates he is concerned due to over the past week he has started to have an increase in itching and believes that the rash is returning that he had on the hands and feet.  He also indicates that he has having some swelling of the lymph nodes in the groin with the left side being worse.  The patient indicates that he also has had some penile discharge over the past several days with it being clear to yellow, with mild intermittent dysuria.  Patient is without fever or chills.  He does indicate that he was concerned that his significant other may have similar symptoms although she has been treated and did test negative for syphilis. Patient also indicates that he has been having some indigestion, heartburn, and reflux which has been causing mild intermittent nausea with occasional vomiting but there is been no blood.  He is tolerating fluids well.     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 Authorizing Provider  famotidine (PEPCID) 20 MG tablet Take 1 tablet (20 mg total) by mouth 2 (two) times daily. 11/25/22  Yes Ellsworth Lennox, PA-C   Spacer/Aero-Holding Chambers DEVI 1 Units by Does not apply route in the morning, at noon, and at bedtime. 11/25/22  Yes Ellsworth Lennox, PA-C  albuterol (VENTOLIN HFA) 108 (90 Base) MCG/ACT inhaler Inhale 1 puff into the lungs every 6 (six) hours as needed for wheezing or shortness of breath. 11/25/22   Ellsworth Lennox, PA-C    Family History Family History  Problem Relation Age of Onset   Healthy Mother    Healthy Father     Social History Social History   Tobacco Use   Smoking status: Every Day    Packs/day: .5    Types: Cigarettes   Smokeless tobacco: Never  Vaping Use   Vaping Use: Never used  Substance Use Topics   Alcohol use: No    Comment: occasionally   Drug use: No    Types: Marijuana     Allergies   Penicillins   Review of Systems Review of Systems  Genitourinary:  Positive for penile discharge (clear to yellow).     Physical Exam Triage Vital Signs ED Triage Vitals  Enc Vitals Group     BP 11/25/22 1406 (!) 143/77     Pulse Rate 11/25/22 1406 63     Resp 11/25/22 1406 18     Temp 11/25/22 1406 97.7 F (36.5 C)     Temp Source 11/25/22 1406 Oral     SpO2 11/25/22  1406 98 %     Weight --      Height --      Head Circumference --      Peak Flow --      Pain Score 11/25/22 1405 0     Pain Loc --      Pain Edu? --      Excl. in GC? --    No data found.  Updated Vital Signs BP (!) 143/77 (BP Location: Left Arm)   Pulse 63   Temp 97.7 F (36.5 C) (Oral)   Resp 18   SpO2 98%   Visual Acuity Right Eye Distance:   Left Eye Distance:   Bilateral Distance:    Right Eye Near:   Left Eye Near:    Bilateral Near:     Physical Exam Constitutional:      Appearance: Normal appearance.  Cardiovascular:     Rate and Rhythm: Normal rate and regular rhythm.     Heart sounds: Normal heart sounds.  Pulmonary:     Effort: Pulmonary effort is normal.     Breath sounds: Normal breath sounds and air entry. No wheezing, rhonchi or rales.  Abdominal:      General: Abdomen is flat. Bowel sounds are normal.     Palpations: Abdomen is soft.     Tenderness: There is no abdominal tenderness.     Comments: Groin: There is mild lymphadenopathy present in the inguinal node region with the left more prominent than the right. Genitals: There is no penile ulcerations present, no active discharge on inspection.  Neurological:     Mental Status: He is alert.      UC Treatments / Results  Labs (all labs ordered are listed, but only abnormal results are displayed) Labs Reviewed  HIV ANTIBODY (ROUTINE TESTING W REFLEX)  RPR  CYTOLOGY, (ORAL, ANAL, URETHRAL) ANCILLARY ONLY    EKG   Radiology No results found.  Procedures Procedures (including critical care time)  Medications Ordered in UC Medications - No data to display  Initial Impression / Assessment and Plan / UC Course  I have reviewed the triage vital signs and the nursing notes.  Pertinent labs & imaging results that were available during my care of the patient were reviewed by me and considered in my medical decision making (see chart for details).    Plan: The diagnosis will be treated with the following: STI screening: A.  Treatment may be modified and initiated depending on results of STI screening. 2.  Urethra-itis: A.  Treatment may be initiated depending on results of STI screening. 3.  Dyspepsia: A.  Pepcid 20 mg twice daily to help decrease indigestion or reflux. 4.  Advised follow-up PCP return to urgent care as needed. Final Clinical Impressions(s) / UC Diagnoses   Final diagnoses:  Routine screening for STI (sexually transmitted infection)  Urethritis  Dyspepsia     Discharge Instructions      Lab results will be completed in 48 hours.  If you do not get a call from this office that indicates the test are negative.  Log onto MyChart to be the test results when they post in 48 hours.  Advised take Pepcid 20 mg twice daily as this will help improve the  indigestion and reflux.  Advised follow-up PCP return to urgent care as needed.    ED Prescriptions     Medication Sig Dispense Auth. Provider   famotidine (PEPCID) 20 MG tablet Take 1 tablet (20 mg total) by  mouth 2 (two) times daily. 30 tablet Ellsworth Lennox, PA-C   albuterol (VENTOLIN HFA) 108 (90 Base) MCG/ACT inhaler Inhale 1 puff into the lungs every 6 (six) hours as needed for wheezing or shortness of breath. 8 g Ellsworth Lennox, PA-C   Spacer/Aero-Holding Chambers DEVI 1 Units by Does not apply route in the morning, at noon, and at bedtime. 1 Units Ellsworth Lennox, PA-C      PDMP not reviewed this encounter.   Ellsworth Lennox, PA-C 11/25/22 1446    Ellsworth Lennox, PA-C 11/25/22 1452

## 2022-11-26 LAB — CYTOLOGY, (ORAL, ANAL, URETHRAL) ANCILLARY ONLY
Chlamydia: NEGATIVE
Comment: NEGATIVE
Comment: NEGATIVE
Comment: NORMAL
Neisseria Gonorrhea: NEGATIVE
Trichomonas: POSITIVE — AB

## 2022-11-26 LAB — RPR
RPR Ser Ql: REACTIVE — AB
RPR Titer: 1:4 {titer}

## 2022-11-27 LAB — T.PALLIDUM AB, TOTAL: T Pallidum Abs: REACTIVE — AB

## 2022-11-28 ENCOUNTER — Telehealth (HOSPITAL_COMMUNITY): Payer: Self-pay | Admitting: Emergency Medicine

## 2022-11-28 MED ORDER — METRONIDAZOLE 500 MG PO TABS: 2000.0000 mg | ORAL_TABLET | Freq: Once | ORAL | 0 refills | Status: AC

## 2022-12-03 ENCOUNTER — Telehealth (HOSPITAL_COMMUNITY): Payer: Self-pay | Admitting: Emergency Medicine

## 2022-12-03 NOTE — Telephone Encounter (Signed)
Patient returned final call tor eview recent results Contacted patient by phone.  Verified identity using two identifiers.  Provided positive result.  Reviewed safe sex practices, notifying partners, and refraining from sexual activities for 7 days from time of treatment.  Patient verified understanding, all questions answered.   Verified pharmacy

## 2023-01-24 ENCOUNTER — Other Ambulatory Visit: Payer: Self-pay

## 2023-01-24 ENCOUNTER — Encounter (HOSPITAL_COMMUNITY): Payer: Self-pay | Admitting: Emergency Medicine

## 2023-01-24 ENCOUNTER — Ambulatory Visit (HOSPITAL_COMMUNITY)
Admission: EM | Admit: 2023-01-24 | Discharge: 2023-01-24 | Disposition: A | Discharge status: Home / Self Care | Payer: Commercial Managed Care - HMO | Attending: Family Medicine | Admitting: Family Medicine

## 2023-01-24 DIAGNOSIS — A599 Trichomoniasis, unspecified: Secondary | ICD-10-CM

## 2023-01-24 MED ORDER — METRONIDAZOLE 500 MG PO TABS
2000.0000 mg | ORAL_TABLET | Freq: Once | ORAL | Status: AC
  Administered 2023-01-24: 2000 mg via ORAL

## 2023-01-24 MED ORDER — METRONIDAZOLE 500 MG PO TABS
ORAL_TABLET | ORAL | Status: AC
  Filled 2023-01-24: qty 4

## 2023-01-24 NOTE — ED Triage Notes (Signed)
Pt states he was diagnosed with STD infection a month ago and he couldn't get his medication he is asking to have this medications reorder to pharmacy so he can get them.

## 2023-01-24 NOTE — ED Notes (Signed)
Instructed to eat ASAP.  Given soda and patient picked up chips in lobby on his way out

## 2023-01-24 NOTE — ED Provider Notes (Signed)
  [facility]   [id number] 01/24/23 Arrival Time: 9562  ASSESSMENT & PLAN:  1. Trichomonal infection    Meds ordered this encounter  Medications   metroNIDAZOLE (FLAGYL) tablet 2,000 mg   No tx indicated for previous RPR/titer.  Reviewed expectations re: course of current medical issues. Questions answered. Outlined signs and symptoms indicating need for more acute intervention. Patient verbalized understanding. After Visit Summary given.   SUBJECTIVE:  Jesse Dean is a 27 y.o. male who states he was diagnosed with STD infection a month ago and he couldn't get his medication he is asking to have this medications reorder to pharmacy so he can get them. No symptoms.   OBJECTIVE:  Vitals:   01/24/23 1011  BP: 124/87  Pulse: (!) 51  Resp: 18  Temp: 98.2 F (36.8 C)  TempSrc: Oral  SpO2: 100%     General appearance: alert, cooperative, appears stated age and no distress Skin: warm and dry Psychological: alert and cooperative; normal mood and affect.   Allergies  Allergen Reactions   Penicillins Anaphylaxis    Has patient had a PCN reaction causing immediate rash, facial/tongue/throat swelling, SOB or lightheadedness with hypotension: Yes Has patient had a PCN reaction causing severe rash involving mucus membranes or skin necrosis: Yes Has patient had a PCN reaction that required hospitalization: No Has patient had a PCN reaction occurring within the last 10 years: Yes If all of the above answers are "NO", then may proceed with Cephalosporin use.     Past Medical History:  Diagnosis Date   Asthma    Subarachnoid hemorrhage (HCC)    Family History  Problem Relation Age of Onset   Healthy Mother    Healthy Father    Social History   Socioeconomic History   Marital status: Single    Spouse name: Not on file   Number of children: Not on file   Years of education: Not on file   Highest education level: Not on file  Occupational History    Not on file  Tobacco Use   Smoking status: Every Day    Packs/day: .5    Types: Cigarettes   Smokeless tobacco: Never  Vaping Use   Vaping Use: Never used  Substance and Sexual Activity   Alcohol use: No    Comment: occasionally   Drug use: No    Types: Marijuana   Sexual activity: Not on file  Other Topics Concern   Not on file  Social History Narrative   Not on file   Social Determinants of Health   Financial Resource Strain: Not on file  Food Insecurity: Not on file  Transportation Needs: Not on file  Physical Activity: Not on file  Stress: Not on file  Social Connections: Not on file  Intimate Partner Violence: Not on file           Mardella Layman, MD 01/24/23 1109

## 2023-04-30 ENCOUNTER — Ambulatory Visit (HOSPITAL_COMMUNITY)
Admission: EM | Admit: 2023-04-30 | Discharge: 2023-04-30 | Disposition: A | Discharge status: Home / Self Care | Payer: Commercial Managed Care - HMO | Attending: Family Medicine | Admitting: Family Medicine

## 2023-04-30 ENCOUNTER — Emergency Department (HOSPITAL_COMMUNITY)
Admission: EM | Admit: 2023-04-30 | Discharge: 2023-04-30 | Discharge status: Against Medical Advice | Payer: Commercial Managed Care - HMO | Attending: Emergency Medicine | Admitting: Emergency Medicine

## 2023-04-30 ENCOUNTER — Encounter (HOSPITAL_COMMUNITY): Payer: Self-pay | Admitting: Emergency Medicine

## 2023-04-30 ENCOUNTER — Other Ambulatory Visit: Payer: Self-pay

## 2023-04-30 ENCOUNTER — Encounter (HOSPITAL_COMMUNITY): Payer: Self-pay | Admitting: *Deleted

## 2023-04-30 DIAGNOSIS — J069 Acute upper respiratory infection, unspecified: Secondary | ICD-10-CM

## 2023-04-30 DIAGNOSIS — Z5321 Procedure and treatment not carried out due to patient leaving prior to being seen by health care provider: Secondary | ICD-10-CM | POA: Diagnosis not present

## 2023-04-30 DIAGNOSIS — J4521 Mild intermittent asthma with (acute) exacerbation: Secondary | ICD-10-CM | POA: Diagnosis not present

## 2023-04-30 DIAGNOSIS — J45901 Unspecified asthma with (acute) exacerbation: Secondary | ICD-10-CM | POA: Insufficient documentation

## 2023-04-30 DIAGNOSIS — R0602 Shortness of breath: Secondary | ICD-10-CM | POA: Diagnosis present

## 2023-04-30 MED ORDER — IPRATROPIUM-ALBUTEROL 0.5-2.5 (3) MG/3ML IN SOLN
RESPIRATORY_TRACT | Status: AC
  Administered 2023-04-30: 3 mL via RESPIRATORY_TRACT
  Filled 2023-04-30: qty 3

## 2023-04-30 MED ORDER — IPRATROPIUM-ALBUTEROL 0.5-2.5 (3) MG/3ML IN SOLN
3.0000 mL | Freq: Once | RESPIRATORY_TRACT | Status: AC
  Filled 2023-04-30: qty 3

## 2023-04-30 MED ORDER — ALBUTEROL SULFATE HFA 108 (90 BASE) MCG/ACT IN AERS: 2.0000 | INHALATION_SPRAY | RESPIRATORY_TRACT | 0 refills | Status: AC | PRN

## 2023-04-30 MED ORDER — METHYLPREDNISOLONE ACETATE 80 MG/ML IJ SUSP
80.0000 mg | Freq: Once | INTRAMUSCULAR | Status: AC
  Administered 2023-04-30: 80 mg via INTRAMUSCULAR

## 2023-04-30 MED ORDER — IPRATROPIUM-ALBUTEROL 0.5-2.5 (3) MG/3ML IN SOLN
3.0000 mL | Freq: Once | RESPIRATORY_TRACT | Status: AC
  Administered 2023-04-30: 3 mL via RESPIRATORY_TRACT
  Filled 2023-04-30: qty 3

## 2023-04-30 MED ORDER — METHYLPREDNISOLONE ACETATE 80 MG/ML IJ SUSP
INTRAMUSCULAR | Status: AC
  Filled 2023-04-30: qty 1

## 2023-04-30 MED ORDER — PREDNISONE 20 MG PO TABS: 40.0000 mg | ORAL_TABLET | Freq: Every day | ORAL | 0 refills | Status: AC

## 2023-04-30 NOTE — ED Triage Notes (Signed)
Pt presents to [facility] with SHOB . Neb treatment given in Triage. Pt reports Sx's for 2 daysbut are now worse.

## 2023-04-30 NOTE — ED Provider Notes (Addendum)
[facility]    CSN: [id number] Arrival date & time: 04/30/23  1014      History   Chief Complaint Chief Complaint  Patient presents with   Shortness of Breath    HPI Jesse Dean is a 27 y.o. male.    Shortness of Breath Here for shortness of breath.  It has been bothering him a lot for 2 days.  It got worse again about 2 hours before presenting here to the urgent care.  No fever or chills.  He has not had an inhaler to use.  He has not had much cough but he has had some congestion.  Of note he was seen overnight in the emergency room and had 2 DuoNebs given.  They would help him each time, and the last one given was about 7 hours prior to being here.  He then left without being seen by the provider about 4 this morning and then about 4 hours later started having more trouble breathing.  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 Authorizing Provider  albuterol (VENTOLIN HFA) 108 (90 Base) MCG/ACT inhaler Inhale 2 puffs into the lungs every 4 (four) hours as needed for wheezing or shortness of breath. 04/30/23  Yes Zenia Resides, MD  predniSONE (DELTASONE) 20 MG tablet Take 2 tablets (40 mg total) by mouth daily with breakfast for 5 days. 04/30/23 05/05/23 Yes Zenia Resides, MD  famotidine (PEPCID) 20 MG tablet Take 1 tablet (20 mg total) by mouth 2 (two) times daily. 11/25/22   Ellsworth Lennox, PA-C  Spacer/Aero-Holding Chambers DEVI 1 Units by Does not apply route in  the morning, at noon, and at bedtime. 11/25/22   Ellsworth Lennox, PA-C    Family History Family History  Problem Relation Age of Onset   Healthy Mother    Healthy Father     Social History Social History   Tobacco Use   Smoking status: Every Day    Current packs/day: 0.50    Types: Cigarettes   Smokeless tobacco: Never  Vaping Use   Vaping status: Never Used  Substance Use Topics   Alcohol use: No    Comment: occasionally   Drug use: No    Types: Marijuana     Allergies   Penicillins   Review of Systems Review of Systems  Respiratory:  Positive for shortness of breath.      Physical Exam Triage Vital Signs ED Triage Vitals [04/30/23 1021]  Encounter Vitals Group     BP      Systolic BP Percentile      Diastolic BP Percentile      Pulse      Resp      Temp      Temp src      SpO2 100 %     Weight      Height      Head Circumference  Peak Flow      Pain Score 0     Pain Loc      Pain Education      Exclude from Growth Chart    No data found.  Updated Vital Signs SpO2 100%   Visual Acuity Right Eye Distance:   Left Eye Distance:   Bilateral Distance:    Right Eye Near:   Left Eye Near:    Bilateral Near:     Physical Exam Vitals reviewed.  Constitutional:      General: He is not in acute distress.    Appearance: He is not toxic-appearing.  HENT:     Nose: Nose normal.     Mouth/Throat:     Mouth: Mucous membranes are moist.     Pharynx: No oropharyngeal exudate or posterior oropharyngeal erythema.  Eyes:     Extraocular Movements: Extraocular movements intact.     Conjunctiva/sclera: Conjunctivae normal.     Pupils: Pupils are equal, round, and reactive to light.  Cardiovascular:     Rate and Rhythm: Normal rate and regular rhythm.     Heart sounds: No murmur heard. Pulmonary:     Breath sounds: No stridor. No rhonchi or rales.     Comments: Initially he is in some respiratory distress with tachypnea and poor air movement and  reduced expiratory phase.   After the [facility] treatment here, he is moving air much better with end expiratory wheezes still heard.   Musculoskeletal:     Cervical back: Neck supple.  Lymphadenopathy:     Cervical: No cervical adenopathy.  Skin:    Capillary Refill: Capillary refill takes less than 2 seconds.     Coloration: Skin is not jaundiced or pale.  Neurological:     General: No focal deficit present.     Mental Status: He is alert and oriented to person, place, and time.  Psychiatric:        Behavior: Behavior normal.      UC Treatments / Results  Labs (all labs ordered are listed, but only abnormal results are displayed) Labs Reviewed  SARS CORONAVIRUS 2 (TAT 6-24 HRS)    EKG   Radiology No results found.  Procedures Procedures (including critical care time)  Medications Ordered in UC Medications  methylPREDNISolone acetate (DEPO-MEDROL) injection 80 mg (has no administration in time range)    Initial Impression / Assessment and Plan / UC Course  I have reviewed the triage vital signs and the nursing notes.  Pertinent labs & imaging results that were available during my care of the patient were reviewed by me and considered in my medical decision making (see chart for details).        Depo-Medrol is given here and then albuterol and burst of prednisone are sent to the pharmacy for asthma exacerbation. COVID swab is done and we will notify him if positive if positive he is a candidate for Paxlovid with his recent severe asthma exacerbation.  Final Clinical Impressions(s) / UC Diagnoses   Final diagnoses:  Mild intermittent asthma with exacerbation  Viral URI     Discharge Instructions      Albuterol inhaler--do 2 puffs every 4 hours as needed for shortness of breath or wheezing  Take prednisone 20 mg--2 daily for 5 days You have been given an injection of methylprednisolone 80 mg, steroid.  You were given a dose of DuoNeb  (ipratropium/albuterol) here in the office in a breathing treatment.      ED Prescriptions  Medication Sig Dispense Auth. Provider   albuterol (VENTOLIN HFA) 108 (90 Base) MCG/ACT inhaler Inhale 2 puffs into the lungs every 4 (four) hours as needed for wheezing or shortness of breath. 1 each Zenia Resides, MD   predniSONE (DELTASONE) 20 MG tablet Take 2 tablets (40 mg total) by mouth daily with breakfast for 5 days. 10 tablet Marlinda Mike Janace Aris, MD      PDMP not reviewed this encounter.   Zenia Resides, MD 04/30/23 1037    Zenia Resides, MD 04/30/23 1037

## 2023-04-30 NOTE — ED Notes (Signed)
Pt reports he is feeling improved.

## 2023-04-30 NOTE — ED Triage Notes (Addendum)
Pt having asthma exacerbation. No meds at home to manage. Sats are 96% RA. Audible wheezing.

## 2023-04-30 NOTE — ED Notes (Signed)
RT to come assess and make further recommendations.

## 2023-04-30 NOTE — Discharge Instructions (Signed)
Albuterol inhaler--do 2 puffs every 4 hours as needed for shortness of breath or wheezing  Take prednisone 20 mg--2 daily for 5 days You have been given an injection of methylprednisolone 80 mg, steroid.  You were given a dose of DuoNeb (ipratropium/albuterol) here in the office in a breathing treatment.

## 2023-04-30 NOTE — ED Notes (Signed)
DR Marlinda Mike informed Pt did not receive the COVID Swab before DC. Offered to call Pt to return for test. DR.Banister agreed Pt did not need to return for test.

## 2023-05-21 ENCOUNTER — Emergency Department (HOSPITAL_COMMUNITY)
Admission: EM | Admit: 2023-05-21 | Discharge: 2023-05-21 | Discharge status: Against Medical Advice | Payer: Commercial Managed Care - HMO

## 2023-05-21 ENCOUNTER — Emergency Department (HOSPITAL_COMMUNITY): Payer: Commercial Managed Care - HMO

## 2023-05-21 ENCOUNTER — Other Ambulatory Visit: Payer: Self-pay

## 2023-05-21 ENCOUNTER — Encounter (HOSPITAL_COMMUNITY): Payer: Self-pay

## 2023-05-21 DIAGNOSIS — M79644 Pain in right finger(s): Secondary | ICD-10-CM | POA: Insufficient documentation

## 2023-05-21 DIAGNOSIS — Z5329 Procedure and treatment not carried out because of patient's decision for other reasons: Secondary | ICD-10-CM | POA: Diagnosis not present

## 2023-05-21 DIAGNOSIS — Y92410 Unspecified street and highway as the place of occurrence of the external cause: Secondary | ICD-10-CM | POA: Diagnosis not present

## 2023-05-21 DIAGNOSIS — R0789 Other chest pain: Secondary | ICD-10-CM | POA: Diagnosis present

## 2023-05-21 MED ORDER — IBUPROFEN 400 MG PO TABS
600.0000 mg | ORAL_TABLET | Freq: Once | ORAL | Status: AC
  Administered 2023-05-21: 600 mg via ORAL
  Filled 2023-05-21: qty 1

## 2023-05-21 NOTE — ED Notes (Signed)
Pt was not at bedside.  PT was discussing leaving. Paramedic encouraged the Pt to stay for results and he stated he would stay for another 30 minutes.
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
## 2023-05-21 NOTE — ED Notes (Signed)
Pt was upset that his XR results were not back as soon as the Xr was completed

## 2023-05-24 ENCOUNTER — Encounter (HOSPITAL_COMMUNITY): Payer: Self-pay

## 2023-05-24 ENCOUNTER — Ambulatory Visit (HOSPITAL_COMMUNITY)
Admission: EM | Admit: 2023-05-24 | Discharge: 2023-05-24 | Discharge status: Against Medical Advice | Payer: Managed Care, Other (non HMO)
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
## 2024-06-30 ENCOUNTER — Ambulatory Visit (HOSPITAL_COMMUNITY): Admission: EM | Admit: 2024-06-30 | Discharge: 2024-06-30 | Discharge status: Against Medical Advice | Payer: Self-pay

## 2024-06-30 NOTE — ED Notes (Signed)
 No answer in waiting room x2.

## 2024-06-30 NOTE — ED Notes (Signed)
No answer in waiting area x2
